# Patient Record
Sex: Male | Born: 1950
Health system: Southern US, Community
[De-identification: ages and names within clinical notes are randomized; demographics above are authoritative.]

## PROBLEM LIST (undated history)

## (undated) DIAGNOSIS — N200 Calculus of kidney: Secondary | ICD-10-CM

## (undated) DIAGNOSIS — M109 Gout, unspecified: Secondary | ICD-10-CM

## (undated) DIAGNOSIS — L409 Psoriasis, unspecified: Secondary | ICD-10-CM

## (undated) DIAGNOSIS — C801 Malignant (primary) neoplasm, unspecified: Secondary | ICD-10-CM

## (undated) DIAGNOSIS — I10 Essential (primary) hypertension: Secondary | ICD-10-CM

## (undated) DIAGNOSIS — D649 Anemia, unspecified: Secondary | ICD-10-CM

## (undated) DIAGNOSIS — Z973 Presence of spectacles and contact lenses: Secondary | ICD-10-CM

## (undated) DIAGNOSIS — Z87898 Personal history of other specified conditions: Secondary | ICD-10-CM

## (undated) DIAGNOSIS — Z972 Presence of dental prosthetic device (complete) (partial): Secondary | ICD-10-CM

## (undated) DIAGNOSIS — Z87442 Personal history of urinary calculi: Secondary | ICD-10-CM

## (undated) DIAGNOSIS — Z8739 Personal history of other diseases of the musculoskeletal system and connective tissue: Secondary | ICD-10-CM

## (undated) DIAGNOSIS — M351 Other overlap syndromes: Secondary | ICD-10-CM

## (undated) DIAGNOSIS — M1990 Unspecified osteoarthritis, unspecified site: Secondary | ICD-10-CM

## (undated) HISTORY — PX: TONSILLECTOMY: SUR1361

---

## 2012-02-28 ENCOUNTER — Emergency Department (HOSPITAL_COMMUNITY): Payer: Managed Care, Other (non HMO)

## 2012-02-28 ENCOUNTER — Encounter (HOSPITAL_COMMUNITY): Payer: Self-pay | Admitting: *Deleted

## 2012-02-28 ENCOUNTER — Emergency Department (HOSPITAL_COMMUNITY)
Admission: EM | Admit: 2012-02-28 | Discharge: 2012-02-29 | Disposition: A | Discharge status: Home / Self Care | Payer: Managed Care, Other (non HMO) | Attending: Emergency Medicine | Admitting: Emergency Medicine

## 2012-02-28 DIAGNOSIS — M7020 Olecranon bursitis, unspecified elbow: Secondary | ICD-10-CM | POA: Insufficient documentation

## 2012-02-28 DIAGNOSIS — M7021 Olecranon bursitis, right elbow: Secondary | ICD-10-CM

## 2012-02-28 DIAGNOSIS — E119 Type 2 diabetes mellitus without complications: Secondary | ICD-10-CM | POA: Insufficient documentation

## 2012-02-28 DIAGNOSIS — I10 Essential (primary) hypertension: Secondary | ICD-10-CM | POA: Insufficient documentation

## 2012-02-28 DIAGNOSIS — M109 Gout, unspecified: Secondary | ICD-10-CM | POA: Insufficient documentation

## 2012-02-28 DIAGNOSIS — M129 Arthropathy, unspecified: Secondary | ICD-10-CM | POA: Insufficient documentation

## 2012-02-28 HISTORY — DX: Unspecified osteoarthritis, unspecified site: M19.90

## 2012-02-28 HISTORY — DX: Gout, unspecified: M10.9

## 2012-02-28 HISTORY — DX: Essential (primary) hypertension: I10

## 2012-02-28 HISTORY — DX: Calculus of kidney: N20.0

## 2012-02-28 MED ORDER — OXYCODONE-ACETAMINOPHEN 5-325 MG PO TABS
1.0000 | ORAL_TABLET | Freq: Once | ORAL | Status: AC
  Administered 2012-02-28: 1 via ORAL
  Filled 2012-02-28: qty 1

## 2012-02-28 NOTE — ED Notes (Signed)
C/o right elbow pain, denies injury, heat to right elbow as well, hx of gout and arthritis

## 2012-02-29 MED ORDER — OXYCODONE-ACETAMINOPHEN 5-325 MG PO TABS: 1.0000 | ORAL_TABLET | ORAL | Status: AC | PRN

## 2012-02-29 MED ORDER — ONDANSETRON 4 MG PO TBDP
4.0000 mg | ORAL_TABLET | Freq: Once | ORAL | Status: AC
  Administered 2012-02-29: 4 mg via ORAL
  Filled 2012-02-29: qty 1

## 2012-02-29 MED ORDER — COLCHICINE 0.6 MG PO TABS: ORAL_TABLET | ORAL | Status: AC

## 2012-02-29 MED ORDER — COLCHICINE 0.6 MG PO TABS: ORAL_TABLET | ORAL | Status: DC

## 2012-02-29 MED ORDER — MORPHINE SULFATE 2 MG/ML IJ SOLN
6.0000 mg | Freq: Once | INTRAMUSCULAR | Status: AC
  Administered 2012-02-29: 6 mg via INTRAMUSCULAR
  Filled 2012-02-29: qty 3

## 2012-02-29 MED ORDER — PREDNISONE 20 MG PO TABS: ORAL_TABLET | ORAL | Status: DC

## 2012-02-29 NOTE — ED Notes (Signed)
Pt stable and ambulatory with steady gait; Instructed not to drive while on pain medication

## 2012-03-05 NOTE — ED Provider Notes (Signed)
History     CSN: [id number]  Arrival date & time 02/28/12  2037   First MD Initiated Contact with Patient 02/28/12 2218      Chief Complaint  Patient presents with  . Elbow Pain    (Consider location/radiation/quality/duration/timing/severity/associated sxs/prior treatment) HPI Comments: Patient c/o pain to his right elbow  For several days.  States he has h/o gout and pain to his elbow feels similar to previous gout attacks.  States he usually takes colchicine when he has similar pain but states his has ran out.  Pain is worse with extension of the arm and improves with rest.  States he uses his right arm frequently .  Denies numbness, neck pain, fever, wound or recent illness.   The history is provided by the patient.    Past Medical History  Diagnosis Date  . Gout   . Arthritis   . Hypertension   . Diabetes mellitus   . Kidney stones     Past Surgical History  Procedure Date  . Tonsillectomy     History reviewed. No pertinent family history.  History  Substance Use Topics  . Smoking status: Not on file  . Smokeless tobacco: Not on file  . Alcohol Use: No      Review of Systems  Constitutional: Negative for fever, chills, activity change and appetite change.  HENT: Negative for neck pain.   Respiratory: Negative for chest tightness and shortness of breath.   Cardiovascular: Negative for chest pain.  Genitourinary: Negative for dysuria and difficulty urinating.  Musculoskeletal: Positive for joint swelling and arthralgias.  Skin: Negative for color change, rash and wound.  Neurological: Negative for dizziness, weakness, numbness and headaches.  All other systems reviewed and are negative.    Allergies  Review of patient's allergies indicates no known allergies.  Home Medications   Current Outpatient Rx  Name Route Sig Dispense Refill  . ACETAMINOPHEN 500 MG PO TABS Oral Take 1,000 mg by mouth as needed.    . ALLOPURINOL 300 MG PO TABS Oral Take 300  mg by mouth daily.    Marland Kitchen AMLODIPINE BESYLATE 5 MG PO TABS Oral Take 5 mg by mouth daily.    . ASPIRIN EC 81 MG PO TBEC Oral Take 81 mg by mouth daily.    . FENOFIBRATE 160 MG PO TABS Oral Take 160 mg by mouth daily.    Marland Kitchen FOLIC ACID 1 MG PO TABS Oral Take 1 mg by mouth daily.    Marland Kitchen LOSARTAN POTASSIUM 100 MG PO TABS Oral Take 100 mg by mouth daily.    Marland Kitchen METFORMIN HCL 1000 MG PO TABS Oral Take 1,000 mg by mouth 2 (two) times daily with a meal.    . METHOTREXATE (ANTI-RHEUMATIC) 2.5 MG PO TABS Oral Take 15 mg by mouth once a week. On SUNDAYS    . METOPROLOL TARTRATE 100 MG PO TABS Oral Take 100 mg by mouth 2 (two) times daily.    Marland Kitchen NABUMETONE 750 MG PO TABS Oral Take 750 mg by mouth 2 (two) times daily.    Marland Kitchen PIOGLITAZONE HCL 45 MG PO TABS Oral Take 45 mg by mouth daily.    Marland Kitchen POTASSIUM CHLORIDE CRYS ER 20 MEQ PO TBCR Oral Take 20 mEq by mouth daily.    Marland Kitchen SIMVASTATIN 10 MG PO TABS Oral Take 10 mg by mouth daily.    . TORSEMIDE 20 MG PO TABS Oral Take 20 mg by mouth daily.    . COLCHICINE 0.6 MG PO  TABS  One tab po q 2 hrs until pain relief or stomach upset 12 tablet 0  . OXYCODONE-ACETAMINOPHEN 5-325 MG PO TABS Oral Take 1 tablet by mouth every 4 (four) hours as needed for pain. 15 tablet 0  . PREDNISONE 20 MG PO TABS  One po qd x 5 days 5 tablet 0    BP 151/92  Pulse 103  Temp 98.2 F (36.8 C) (Oral)  Resp 20  Ht 6\' 4"  (1.93 m)  Wt 285 lb (129.275 kg)  BMI 34.69 kg/m2  SpO2 100%  Physical Exam  Nursing note and vitals reviewed. Constitutional: He is oriented to person, place, and time. He appears well-developed and well-nourished. No distress.  HENT:  Head: Normocephalic and atraumatic.  Cardiovascular: Normal rate, regular rhythm and normal heart sounds.   Pulmonary/Chest: Effort normal and breath sounds normal.  Musculoskeletal: He exhibits edema and tenderness.       Right elbow: He exhibits swelling. He exhibits normal range of motion, no effusion, no deformity and no laceration.  tenderness found. Lateral epicondyle and olecranon process tenderness noted.       Arms:      ttp at the olecranon process of the right elbow.  Radial pulse is brisk, sensation intact.  CR< 2 sec.  Pt has full ROM elbow,but pain with extension.  Mild erythema.  No wound  Neurological: He is alert and oriented to person, place, and time. He exhibits normal muscle tone. Coordination normal.  Skin: Skin is warm and dry.    ED Course  Procedures (including critical care time)  Results for orders placed during the hospital encounter of 02/28/12  URIC ACID      Component Value Range   Uric Acid, Serum 7.5  4.0 - 7.8 mg/dL     Dg Elbow Complete Right  02/28/2012  *RADIOLOGY REPORT*  Clinical Data: Right elbow pain and swelling.  No injury.  History of arthritis and gout.  RIGHT ELBOW - COMPLETE 3+ VIEW  Comparison: None.  Findings: Degenerative narrowing and hypertrophic changes in the elbow joint.  Moderate anterior left elbow effusion.  No focal bone or cortical erosion.  No focal bone lesion or destruction.  No radiopaque soft tissue foreign bodies or calcifications.  IMPRESSION: Degenerative changes in the right elbow with moderate sized right elbow effusion.  No erosive changes suggested.  Original Report Authenticated By: Marlon Pel, M.D.     1. Olecranon bursitis of right elbow       MDM     Localized ttp and edema of the posterior right elbow.  Pt has full ROM but pain reproduced with extension.  Pt is non-toxic appearing.  No excessive warmth of the joint on exam or hx of recent illness.  Doubtful of septic joint.  Pt agrees to close orthopedic follow-up or to return here if sx worsen  The patient appears reasonably screened and/or stabilized for discharge and I doubt any other medical condition or other [facility] requiring further screening, evaluation, or treatment in the ED at this time prior to discharge.      Ehtan Delfavero L. [place], [place] 03/05/12 2223

## 2012-03-08 NOTE — ED Provider Notes (Signed)
Medical screening examination/treatment/procedure(s) were performed by non-physician practitioner and as supervising physician I was immediately available for consultation/collaboration.  Donnetta Hutching, MD 03/08/12 [phone]

## 2014-07-02 ENCOUNTER — Encounter: Payer: Self-pay | Admitting: Surgery

## 2014-07-14 ENCOUNTER — Encounter: Payer: Self-pay | Admitting: Surgery

## 2016-04-28 ENCOUNTER — Other Ambulatory Visit: Payer: Self-pay | Admitting: Urology

## 2016-04-28 ENCOUNTER — Ambulatory Visit (INDEPENDENT_AMBULATORY_CARE_PROVIDER_SITE_OTHER): Payer: 59 | Admitting: Urology

## 2016-04-28 DIAGNOSIS — R972 Elevated prostate specific antigen [PSA]: Secondary | ICD-10-CM

## 2016-06-02 ENCOUNTER — Ambulatory Visit (HOSPITAL_COMMUNITY)
Admission: RE | Admit: 2016-06-02 | Discharge: 2016-06-02 | Disposition: A | Discharge status: Home / Self Care | Payer: 59 | Source: Ambulatory Visit | Attending: Urology | Admitting: Urology

## 2016-06-02 DIAGNOSIS — C61 Malignant neoplasm of prostate: Secondary | ICD-10-CM | POA: Diagnosis not present

## 2016-06-02 DIAGNOSIS — R972 Elevated prostate specific antigen [PSA]: Secondary | ICD-10-CM | POA: Diagnosis not present

## 2016-06-02 MED ORDER — LIDOCAINE HCL (PF) 2 % IJ SOLN
INTRAMUSCULAR | Status: AC
  Administered 2016-06-02: 10 mL
  Filled 2016-06-02: qty 10

## 2016-06-02 MED ORDER — CEFTRIAXONE SODIUM 1 G IJ SOLR
1.0000 g | Freq: Once | INTRAMUSCULAR | Status: AC
  Administered 2016-06-02: 1 g via INTRAMUSCULAR

## 2016-06-02 MED ORDER — CEFTRIAXONE SODIUM 1 G IJ SOLR
INTRAMUSCULAR | Status: AC
  Filled 2016-06-02: qty 10

## 2016-06-02 MED ORDER — LIDOCAINE HCL (PF) 1 % IJ SOLN
INTRAMUSCULAR | Status: AC
  Administered 2016-06-02: 5 mL
  Filled 2016-06-02: qty 5

## 2016-06-02 NOTE — Discharge Instructions (Signed)
Transrectal Ultrasound-Guided Biopsy °A transrectal ultrasound-guided biopsy is a procedure to take samples of tissue from your prostate. Ultrasound images are used to guide the procedure. It is usually done to check the prostate gland for cancer. °BEFORE THE PROCEDURE °· Do not eat or drink after midnight on the night before your procedure. °· Take medicines as your doctor tells you. °· Your doctor may have you stop taking some medicines 5-7 days before the procedure. °· You will be given an enema before your procedure. During an enema, a liquid is put into your butt (rectum) to clear out waste. °· You may have lab tests the day of your procedure. °· Make plans to have someone drive you home. °PROCEDURE °· You will be given medicine to help you relax before the procedure. An IV tube will be put into one of your veins. It will be used to give fluids and medicine. °· You will be given medicine to reduce the risk of infection (antibiotic). °· You will be placed on your side. °· A probe with gel will be put in your butt. This is used to take pictures of your prostate and the area around it. °· A medicine to numb the area is put into your prostate. °· A biopsy needle is then inserted and guided to your prostate. °· Samples of prostate tissue are taken. The needle is removed. °· The samples are sent to a lab to be checked. Results are usually back in 2-3 days. °AFTER THE PROCEDURE °· You will be taken to a room where you will be watched until you are doing okay. °· You may have some pain in the area around your butt. You will be given medicines for this. °· You may be able to go home the same day. Sometimes, an overnight stay in the hospital is needed. °  °This information is not intended to replace advice given to you by your health care provider. Make sure you discuss any questions you have with your health care provider. °  °Document Released: 07/19/2009 Document Revised: 08/05/2013 Document Reviewed:  03/19/2013 °Elsevier Interactive Patient Education ©2016 [employer] ° °

## 2016-06-16 ENCOUNTER — Ambulatory Visit (INDEPENDENT_AMBULATORY_CARE_PROVIDER_SITE_OTHER): Payer: 59 | Admitting: Urology

## 2016-06-16 DIAGNOSIS — C61 Malignant neoplasm of prostate: Secondary | ICD-10-CM

## 2016-08-11 ENCOUNTER — Telehealth: Payer: Self-pay | Admitting: *Deleted

## 2016-08-11 NOTE — Telephone Encounter (Signed)
Called patient to ask question, lvm for a return call 

## 2016-08-11 NOTE — Telephone Encounter (Signed)
CALLED PATIENT TO INFORM OF PRE-SEED APPT. ON 08-25-16 @ 2 PM, LVM FOR A RETURN CALL

## 2016-08-24 ENCOUNTER — Telehealth: Payer: Self-pay | Admitting: *Deleted

## 2016-08-24 NOTE — Telephone Encounter (Signed)
RETURNED PATIENT'S WIFE PHONE CALL, NO ANSWER

## 2016-08-24 NOTE — Telephone Encounter (Signed)
Called patient to remind of pre-seed appt. For 08-25-16, spoke with patient's wife Thayer Headings and they are aware of this appt.

## 2016-08-25 ENCOUNTER — Ambulatory Visit
Admission: RE | Admit: 2016-08-25 | Discharge: 2016-08-25 | Disposition: A | Discharge status: Home / Self Care | Payer: Managed Care, Other (non HMO) | Source: Ambulatory Visit | Attending: Radiation Oncology | Admitting: Radiation Oncology

## 2016-08-25 ENCOUNTER — Ambulatory Visit
Admission: RE | Admit: 2016-08-25 | Discharge: 2016-08-25 | Disposition: A | Discharge status: Home / Self Care | Payer: 59 | Source: Ambulatory Visit | Attending: Radiation Oncology | Admitting: Radiation Oncology

## 2016-08-25 DIAGNOSIS — C61 Malignant neoplasm of prostate: Secondary | ICD-10-CM

## 2016-08-25 NOTE — Progress Notes (Signed)
Consult at [facility] by me on 06/28/16    Planned Seed Implant

## 2016-08-25 NOTE — Progress Notes (Signed)
  Radiation Oncology         [phone] ________________________________  Name: SCHYLER MCKEEN MRN: [id number]  Date: 08/25/2016  DOB: 1950/08/29  SIMULATION AND TREATMENT PLANNING NOTE PUBIC ARCH STUDY  [id number], MD  Asencion Noble, MD  DIAGNOSIS: 66 yo man with T1c prostate cancer, Gleason 3+4 and PSA 4.9       ICD-9-CM ICD-10-CM   1. Malignant neoplasm of prostate ([place]) 185 C61     COMPLEX SIMULATION:  The patient presented today for evaluation for possible prostate seed implant. He was brought to the radiation planning suite and placed supine on the CT couch. A 3-dimensional image study set was obtained in upload to the planning computer. There, on each axial slice, I contoured the prostate gland. Then, using three-dimensional radiation planning tools I reconstructed the prostate in view of the structures from the transperineal needle pathway to assess for possible pubic arch interference. In doing so, I did not appreciate any pubic arch interference. Also, the patient's prostate volume was estimated based on the drawn structure. The volume was 33 cc.  Given the pubic arch appearance and prostate volume, patient remains a good candidate to proceed with prostate seed implant. Today, he freely provided informed written consent to proceed.    PLAN: The patient will undergo prostate seed implant.   ________________________________  Sheral Apley. Tammi Klippel, M.D.

## 2016-08-28 ENCOUNTER — Other Ambulatory Visit: Payer: Self-pay | Admitting: Urology

## 2016-08-29 ENCOUNTER — Telehealth: Payer: Self-pay | Admitting: *Deleted

## 2016-08-29 NOTE — Telephone Encounter (Signed)
CALLED PATIENT TO INFORM OF IMPLANT DATE, SPOKE WITH PATIENT'S WIFE , JANICE AND SHE IS AWARE OF THIS IMPLANT DATE

## 2016-09-01 ENCOUNTER — Ambulatory Visit (HOSPITAL_BASED_OUTPATIENT_CLINIC_OR_DEPARTMENT_OTHER)
Admission: RE | Admit: 2016-09-01 | Discharge: 2016-09-01 | Disposition: A | Discharge status: Home / Self Care | Payer: 59 | Source: Ambulatory Visit | Attending: Urology | Admitting: Urology

## 2016-09-01 ENCOUNTER — Encounter (HOSPITAL_BASED_OUTPATIENT_CLINIC_OR_DEPARTMENT_OTHER)
Admission: RE | Admit: 2016-09-01 | Discharge: 2016-09-01 | Disposition: A | Discharge status: Home / Self Care | Payer: 59 | Source: Ambulatory Visit | Attending: Urology | Admitting: Urology

## 2016-09-01 ENCOUNTER — Other Ambulatory Visit: Payer: Self-pay

## 2016-09-01 DIAGNOSIS — Z01818 Encounter for other preprocedural examination: Secondary | ICD-10-CM | POA: Diagnosis present

## 2016-10-20 ENCOUNTER — Encounter (HOSPITAL_BASED_OUTPATIENT_CLINIC_OR_DEPARTMENT_OTHER): Payer: Self-pay | Admitting: *Deleted

## 2016-10-20 DIAGNOSIS — Z7982 Long term (current) use of aspirin: Secondary | ICD-10-CM | POA: Diagnosis not present

## 2016-10-20 DIAGNOSIS — L4050 Arthropathic psoriasis, unspecified: Secondary | ICD-10-CM | POA: Diagnosis not present

## 2016-10-20 DIAGNOSIS — C61 Malignant neoplasm of prostate: Secondary | ICD-10-CM | POA: Diagnosis present

## 2016-10-20 DIAGNOSIS — M109 Gout, unspecified: Secondary | ICD-10-CM | POA: Diagnosis not present

## 2016-10-20 DIAGNOSIS — I10 Essential (primary) hypertension: Secondary | ICD-10-CM | POA: Diagnosis not present

## 2016-10-20 DIAGNOSIS — Z7984 Long term (current) use of oral hypoglycemic drugs: Secondary | ICD-10-CM | POA: Diagnosis not present

## 2016-10-20 DIAGNOSIS — Z7952 Long term (current) use of systemic steroids: Secondary | ICD-10-CM | POA: Diagnosis not present

## 2016-10-20 DIAGNOSIS — Z79899 Other long term (current) drug therapy: Secondary | ICD-10-CM | POA: Diagnosis not present

## 2016-10-20 DIAGNOSIS — E119 Type 2 diabetes mellitus without complications: Secondary | ICD-10-CM | POA: Diagnosis not present

## 2016-10-20 LAB — COMPREHENSIVE METABOLIC PANEL
ALBUMIN: 4.3 g/dL (ref 3.5–5.0)
ALT: 27 U/L (ref 17–63)
ANION GAP: 7 (ref 5–15)
AST: 18 U/L (ref 15–41)
Alkaline Phosphatase: 81 U/L (ref 38–126)
BILIRUBIN TOTAL: 1.2 mg/dL (ref 0.3–1.2)
BUN: 18 mg/dL (ref 6–20)
CO2: 29 mmol/L (ref 22–32)
Calcium: 9.5 mg/dL (ref 8.9–10.3)
Chloride: 105 mmol/L (ref 101–111)
Creatinine, Ser: 1.46 mg/dL — ABNORMAL HIGH (ref 0.61–1.24)
GFR calc Af Amer: 57 mL/min — ABNORMAL LOW (ref >60)
GFR calc non Af Amer: 49 mL/min — ABNORMAL LOW (ref >60)
GLUCOSE: 88 mg/dL (ref 65–99)
POTASSIUM: 4.7 mmol/L (ref 3.5–5.1)
Sodium: 141 mmol/L (ref 135–145)
TOTAL PROTEIN: 7.8 g/dL (ref 6.5–8.1)

## 2016-10-20 LAB — CBC
HEMATOCRIT: 43.2 % (ref 39.0–52.0)
Hemoglobin: 14.3 g/dL (ref 13.0–17.0)
MCH: 30.6 pg (ref 26.0–34.0)
MCHC: 33.1 g/dL (ref 30.0–36.0)
MCV: 92.5 fL (ref 78.0–100.0)
PLATELETS: 308 10*3/uL (ref 150–400)
RBC: 4.67 MIL/uL (ref 4.22–5.81)
RDW: 14.5 % (ref 11.5–15.5)
WBC: 9 10*3/uL (ref 4.0–10.5)

## 2016-10-20 LAB — PROTIME-INR
INR: 1.08
PROTHROMBIN TIME: 14 s (ref 11.4–15.2)

## 2016-10-20 LAB — APTT: APTT: 28 s (ref 24–36)

## 2016-10-20 NOTE — Progress Notes (Signed)
Pt instructed npo pmn 3/15 x plaquenil, lopressor w sip of water.  To [facility] 3/16 @ 0915.  Labs, ekg, cxr in epic.  Pt aware to do fleets enema am of surgery, prior to arrival.

## 2016-10-23 ENCOUNTER — Ambulatory Visit: Admission: RE | Admit: 2016-10-23 | Payer: 59 | Source: Ambulatory Visit

## 2016-10-26 ENCOUNTER — Telehealth: Payer: Self-pay | Admitting: *Deleted

## 2016-10-26 NOTE — Progress Notes (Signed)
  Radiation Oncology         [phone] ________________________________  Name: Peter Martinez MRN: [id number]  Date: 10/26/2016  DOB: 1951/03/21       Prostate Seed Implant  TH:YHOOI,LNZ, MD  No ref. provider found  DIAGNOSIS: 66 yo man with T1c prostate cancer, Gleason 3+4 and PSA 4.9.    ICD-9-CM ICD-10-CM   1. Pre-op testing V72.84 Z01.818 DG Chest 2 View     DG Chest 2 View    PROCEDURE: Insertion of radioactive I-125 seeds into the prostate gland.  RADIATION DOSE: 145 Gy, definitive therapy.  TECHNIQUE: Peter Martinez was brought to the operating room with the urologist. He was placed in the dorsolithotomy position. He was catheterized and a rectal tube was inserted. The perineum was shaved, prepped and draped. The ultrasound probe was then introduced into the rectum to see the prostate gland.  TREATMENT DEVICE: A needle grid was attached to the ultrasound probe stand and anchor needles were placed.  3D PLANNING: The prostate was imaged in 3D using a sagittal sweep of the prostate probe. These images were transferred to the planning computer. There, the prostate, urethra and rectum were defined on each axial reconstructed image. Then, the software created an optimized 3D plan and a few seed positions were adjusted. The quality of the plan was reviewed using [facility] information for the target and the following two organs at risk:  Urethra and Rectum.  Then the accepted plan was uploaded to the seed Selectron afterloading unit.  PROSTATE VOLUME STUDY:  Using transrectal ultrasound the volume of the prostate was verified to be 38 cc.  SPECIAL TREATMENT PROCEDURE/SUPERVISION AND HANDLING: The Nucletron FIRST system was used to place the needles under sagittal guidance. A total of 24 needles were used to deposit 73 seeds in the prostate gland. The individual seed activity was 0.422 mCi.  COMPLEX SIMULATION: At the end of the procedure, an anterior radiograph of the pelvis was obtained to  document seed positioning and count. Cystoscopy was performed to check the urethra and bladder.  MICRODOSIMETRY: At the end of the procedure, the patient was emitting 0.174 mR/hr at 1 meter. Accordingly, he was considered safe for hospital discharge.  PLAN: The patient will return to the radiation oncology clinic for post implant CT dosimetry in three weeks.   ________________________________  Peter Martinez, M.D.

## 2016-10-26 NOTE — Telephone Encounter (Signed)
Called patient to remind of implant for 10-27-16, lvm for a return call

## 2016-10-27 ENCOUNTER — Encounter (HOSPITAL_BASED_OUTPATIENT_CLINIC_OR_DEPARTMENT_OTHER)
Admission: RE | Disposition: A | Discharge status: Home / Self Care | Payer: Self-pay | Source: Ambulatory Visit | Attending: Urology

## 2016-10-27 ENCOUNTER — Ambulatory Visit (HOSPITAL_BASED_OUTPATIENT_CLINIC_OR_DEPARTMENT_OTHER): Payer: 59 | Admitting: Anesthesiology

## 2016-10-27 ENCOUNTER — Ambulatory Visit (HOSPITAL_COMMUNITY): Payer: 59

## 2016-10-27 ENCOUNTER — Encounter (HOSPITAL_BASED_OUTPATIENT_CLINIC_OR_DEPARTMENT_OTHER): Payer: Self-pay | Admitting: Anesthesiology

## 2016-10-27 ENCOUNTER — Ambulatory Visit (HOSPITAL_BASED_OUTPATIENT_CLINIC_OR_DEPARTMENT_OTHER)
Admission: RE | Admit: 2016-10-27 | Discharge: 2016-10-27 | Disposition: A | Discharge status: Home / Self Care | Payer: 59 | Source: Ambulatory Visit | Attending: Urology | Admitting: Urology

## 2016-10-27 DIAGNOSIS — C61 Malignant neoplasm of prostate: Secondary | ICD-10-CM | POA: Insufficient documentation

## 2016-10-27 DIAGNOSIS — L4050 Arthropathic psoriasis, unspecified: Secondary | ICD-10-CM | POA: Insufficient documentation

## 2016-10-27 DIAGNOSIS — Z7982 Long term (current) use of aspirin: Secondary | ICD-10-CM | POA: Insufficient documentation

## 2016-10-27 DIAGNOSIS — E119 Type 2 diabetes mellitus without complications: Secondary | ICD-10-CM | POA: Insufficient documentation

## 2016-10-27 DIAGNOSIS — M109 Gout, unspecified: Secondary | ICD-10-CM | POA: Insufficient documentation

## 2016-10-27 DIAGNOSIS — Z79899 Other long term (current) drug therapy: Secondary | ICD-10-CM | POA: Insufficient documentation

## 2016-10-27 DIAGNOSIS — I10 Essential (primary) hypertension: Secondary | ICD-10-CM | POA: Insufficient documentation

## 2016-10-27 DIAGNOSIS — Z01818 Encounter for other preprocedural examination: Secondary | ICD-10-CM

## 2016-10-27 DIAGNOSIS — Z7984 Long term (current) use of oral hypoglycemic drugs: Secondary | ICD-10-CM | POA: Insufficient documentation

## 2016-10-27 DIAGNOSIS — Z7952 Long term (current) use of systemic steroids: Secondary | ICD-10-CM | POA: Insufficient documentation

## 2016-10-27 HISTORY — PX: RADIOACTIVE SEED IMPLANT: SHX5150

## 2016-10-27 HISTORY — DX: Presence of dental prosthetic device (complete) (partial): Z97.2

## 2016-10-27 HISTORY — DX: Personal history of other diseases of the musculoskeletal system and connective tissue: Z87.39

## 2016-10-27 HISTORY — DX: Presence of spectacles and contact lenses: Z97.3

## 2016-10-27 HISTORY — DX: Personal history of other specified conditions: Z87.898

## 2016-10-27 LAB — GLUCOSE, CAPILLARY
GLUCOSE-CAPILLARY: 94 mg/dL (ref 65–99)
Glucose-Capillary: 84 mg/dL (ref 65–99)

## 2016-10-27 SURGERY — INSERTION, RADIATION SOURCE, PROSTATE
Anesthesia: General

## 2016-10-27 MED ORDER — FENTANYL CITRATE (PF) 100 MCG/2ML IJ SOLN
INTRAMUSCULAR | Status: AC
  Filled 2016-10-27: qty 2

## 2016-10-27 MED ORDER — MIDAZOLAM HCL 5 MG/5ML IJ SOLN
INTRAMUSCULAR | Status: DC | PRN
  Administered 2016-10-27: 2 mg via INTRAVENOUS

## 2016-10-27 MED ORDER — SUGAMMADEX SODIUM 200 MG/2ML IV SOLN
INTRAVENOUS | Status: AC
  Filled 2016-10-27: qty 2

## 2016-10-27 MED ORDER — SODIUM CHLORIDE 0.9 % IV SOLN
INTRAVENOUS | Status: DC | PRN
  Administered 2016-10-27: 1000 mL

## 2016-10-27 MED ORDER — PROPOFOL 10 MG/ML IV BOLUS
INTRAVENOUS | Status: AC
  Filled 2016-10-27: qty 20

## 2016-10-27 MED ORDER — PROPOFOL 10 MG/ML IV BOLUS
INTRAVENOUS | Status: DC | PRN
  Administered 2016-10-27: 50 mg via INTRAVENOUS
  Administered 2016-10-27: 200 mg via INTRAVENOUS

## 2016-10-27 MED ORDER — ONDANSETRON HCL 4 MG/2ML IJ SOLN
INTRAMUSCULAR | Status: DC | PRN
  Administered 2016-10-27: 4 mg via INTRAVENOUS

## 2016-10-27 MED ORDER — FENTANYL CITRATE (PF) 100 MCG/2ML IJ SOLN
INTRAMUSCULAR | Status: DC | PRN
  Administered 2016-10-27 (×2): 50 ug via INTRAVENOUS

## 2016-10-27 MED ORDER — HYDROMORPHONE HCL 1 MG/ML IJ SOLN
0.2500 mg | INTRAMUSCULAR | Status: DC | PRN
  Filled 2016-10-27: qty 0.5

## 2016-10-27 MED ORDER — METOPROLOL TARTRATE 100 MG PO TABS
100.0000 mg | ORAL_TABLET | Freq: Once | ORAL | Status: AC
  Administered 2016-10-27: 100 mg via ORAL
  Filled 2016-10-27 (×2): qty 1

## 2016-10-27 MED ORDER — LIDOCAINE 2% (20 MG/ML) 5 ML SYRINGE
INTRAMUSCULAR | Status: DC | PRN
  Administered 2016-10-27: 50 mg via INTRAVENOUS

## 2016-10-27 MED ORDER — MIDAZOLAM HCL 2 MG/2ML IJ SOLN
INTRAMUSCULAR | Status: AC
  Filled 2016-10-27: qty 2

## 2016-10-27 MED ORDER — DEXAMETHASONE SODIUM PHOSPHATE 10 MG/ML IJ SOLN
INTRAMUSCULAR | Status: AC
  Filled 2016-10-27: qty 1

## 2016-10-27 MED ORDER — TRAMADOL HCL 50 MG PO TABS: 50.0000 mg | ORAL_TABLET | Freq: Four times a day (QID) | ORAL | 0 refills | Status: DC | PRN

## 2016-10-27 MED ORDER — SUCCINYLCHOLINE CHLORIDE 20 MG/ML IJ SOLN
INTRAMUSCULAR | Status: DC | PRN
  Administered 2016-10-27: 100 mg via INTRAVENOUS

## 2016-10-27 MED ORDER — EPHEDRINE 5 MG/ML INJ
INTRAVENOUS | Status: AC
  Filled 2016-10-27: qty 20

## 2016-10-27 MED ORDER — GLYCOPYRROLATE 0.2 MG/ML IJ SOLN
INTRAMUSCULAR | Status: DC | PRN
  Administered 2016-10-27: 0.2 mg via INTRAVENOUS

## 2016-10-27 MED ORDER — EPHEDRINE SULFATE 50 MG/ML IJ SOLN
INTRAMUSCULAR | Status: DC | PRN
  Administered 2016-10-27: 5 mg via INTRAVENOUS
  Administered 2016-10-27: 10 mg via INTRAVENOUS
  Administered 2016-10-27: 5 mg via INTRAVENOUS
  Administered 2016-10-27: 10 mg via INTRAVENOUS
  Administered 2016-10-27: 5 mg via INTRAVENOUS

## 2016-10-27 MED ORDER — DEXAMETHASONE SODIUM PHOSPHATE 4 MG/ML IJ SOLN
INTRAMUSCULAR | Status: DC | PRN
  Administered 2016-10-27: 8 mg via INTRAVENOUS

## 2016-10-27 MED ORDER — LIDOCAINE 2% (20 MG/ML) 5 ML SYRINGE
INTRAMUSCULAR | Status: AC
  Filled 2016-10-27: qty 5

## 2016-10-27 MED ORDER — LACTATED RINGERS IV SOLN
INTRAVENOUS | Status: DC
  Administered 2016-10-27 (×3): via INTRAVENOUS
  Filled 2016-10-27: qty 1000

## 2016-10-27 MED ORDER — GENTAMICIN SULFATE 40 MG/ML IJ SOLN
480.0000 mg | Freq: Once | INTRAVENOUS | Status: AC
  Administered 2016-10-27: 480 mg via INTRAVENOUS
  Filled 2016-10-27 (×2): qty 12

## 2016-10-27 MED ORDER — IOHEXOL 300 MG/ML  SOLN
INTRAMUSCULAR | Status: DC | PRN
  Administered 2016-10-27: 5 mL

## 2016-10-27 MED ORDER — FLEET ENEMA 7-19 GM/118ML RE ENEM
1.0000 | ENEMA | Freq: Once | RECTAL | Status: AC
  Administered 2016-10-27: 1 via RECTAL
  Filled 2016-10-27: qty 1

## 2016-10-27 MED ORDER — ROCURONIUM BROMIDE 100 MG/10ML IV SOLN
INTRAVENOUS | Status: DC | PRN
  Administered 2016-10-27: 30 mg via INTRAVENOUS

## 2016-10-27 MED ORDER — TAMSULOSIN HCL 0.4 MG PO CAPS: 0.4000 mg | ORAL_CAPSULE | Freq: Every day | ORAL | 0 refills | Status: DC

## 2016-10-27 MED ORDER — SUGAMMADEX SODIUM 200 MG/2ML IV SOLN
INTRAVENOUS | Status: DC | PRN
  Administered 2016-10-27: 198.2 mg via INTRAVENOUS

## 2016-10-27 MED ORDER — ONDANSETRON HCL 4 MG/2ML IJ SOLN
INTRAMUSCULAR | Status: AC
  Filled 2016-10-27: qty 2

## 2016-10-27 SURGICAL SUPPLY — 29 items
BAG URINE DRAINAGE (UROLOGICAL SUPPLIES) ×2 IMPLANT
BASKET ZERO TIP NITINOL 2.4FR (BASKET) ×2 IMPLANT
BLADE CLIPPER SURG (BLADE) ×2 IMPLANT
CATH FOLEY 2WAY SLVR  5CC 16FR (CATHETERS) ×2
CATH FOLEY 2WAY SLVR 5CC 16FR (CATHETERS) ×2 IMPLANT
CATH ROBINSON RED A/P 20FR (CATHETERS) ×2 IMPLANT
CLOTH BEACON ORANGE TIMEOUT ST (SAFETY) ×2 IMPLANT
COVER BACK TABLE 60X90IN (DRAPES) ×2 IMPLANT
COVER MAYO STAND STRL (DRAPES) ×2 IMPLANT
DRSG TEGADERM 4X4.75 (GAUZE/BANDAGES/DRESSINGS) ×2 IMPLANT
DRSG TEGADERM 8X12 (GAUZE/BANDAGES/DRESSINGS) ×2 IMPLANT
GAUZE SPONGE 4X4 12PLY STRL LF (GAUZE/BANDAGES/DRESSINGS) ×2 IMPLANT
GLOVE BIO SURGEON STRL SZ7.5 (GLOVE) ×4 IMPLANT
GLOVE ECLIPSE 8.0 STRL XLNG CF (GLOVE) ×16 IMPLANT
GOWN STRL REUS W/ TWL LRG LVL3 (GOWN DISPOSABLE) ×1 IMPLANT
GOWN STRL REUS W/ TWL XL LVL3 (GOWN DISPOSABLE) ×1 IMPLANT
GOWN STRL REUS W/TWL LRG LVL3 (GOWN DISPOSABLE) ×1
GOWN STRL REUS W/TWL XL LVL3 (GOWN DISPOSABLE) ×1
HOLDER FOLEY CATH W/STRAP (MISCELLANEOUS) ×2 IMPLANT
IV NS 1000ML (IV SOLUTION) ×1
IV NS 1000ML BAXH (IV SOLUTION) ×1 IMPLANT
KIT RM TURNOVER CYSTO AR (KITS) ×2 IMPLANT
MANIFOLD NEPTUNE II (INSTRUMENTS) IMPLANT
PACK CYSTO (CUSTOM PROCEDURE TRAY) ×2 IMPLANT
SYRINGE 10CC LL (SYRINGE) ×2 IMPLANT
TUBE CONNECTING 12X1/4 (SUCTIONS) IMPLANT
UNDERPAD 30X30 INCONTINENT (UNDERPADS AND DIAPERS) ×4 IMPLANT
WATER STERILE IRR 500ML POUR (IV SOLUTION) ×2 IMPLANT
radioactive seeds ×2 IMPLANT

## 2016-10-27 NOTE — Anesthesia Preprocedure Evaluation (Signed)
Anesthesia Evaluation  Patient identified by MRN, date of birth, ID band Patient awake    Reviewed: Allergy & Precautions, NPO status , Patient's Chart, lab work & pertinent test results  Airway Mallampati: II  TM Distance: >3 FB     Dental   Pulmonary neg pulmonary ROS,    breath sounds clear to auscultation       Cardiovascular hypertension,  Rhythm:Regular Rate:Normal     Neuro/Psych negative neurological ROS     GI/Hepatic negative GI ROS, Neg liver ROS,   Endo/Other  diabetes  Renal/GU Renal disease     Musculoskeletal  (+) Arthritis ,   Abdominal   Peds  Hematology   Anesthesia Other Findings   Reproductive/Obstetrics                             Anesthesia Physical Anesthesia Plan  ASA: III  Anesthesia Plan: General   Post-op Pain Management:    Induction: Intravenous  Airway Management Planned: Oral ETT  Additional Equipment:   Intra-op Plan:   Post-operative Plan: Extubation in OR  Informed Consent: I have reviewed the patients History and Physical, chart, labs and discussed the procedure including the risks, benefits and alternatives for the proposed anesthesia with the patient or authorized representative who has indicated his/her understanding and acceptance.   Dental advisory given  Plan Discussed with: CRNA and Anesthesiologist  Anesthesia Plan Comments:         Anesthesia Quick Evaluation

## 2016-10-27 NOTE — Brief Op Note (Signed)
10/27/2016  1:18 PM  PATIENT:  Peter Martinez  66 y.o. male  PRE-OPERATIVE DIAGNOSIS:  PROSTATE CANCER  POST-OPERATIVE DIAGNOSIS:  PROSTATE CANCER  PROCEDURE:  Procedure(s): RADIOACTIVE SEED IMPLANT/BRACHYTHERAPY IMPLANT (N/A), Cystoscopy  SURGEON:  Surgeon(s) and Role:    * Alexis Frock, MD - Primary    * Tyler Pita, MD - Assisting  PHYSICIAN ASSISTANT:   ASSISTANTS: none   ANESTHESIA:   general  EBL:  Total I/O In: 1000 [I.V.:1000] Out: 5 [Blood:5]  BLOOD ADMINISTERED:none  DRAINS: none   LOCAL MEDICATIONS USED:  NONE  SPECIMEN:  No Specimen  DISPOSITION OF SPECIMEN:  N/A  COUNTS:  YES  TOURNIQUET:  * No tourniquets in log *  DICTATION: .Other Dictation: Dictation Number [id number]  PLAN OF CARE: Discharge to home after PACU  PATIENT DISPOSITION:  PACU - hemodynamically stable.   Delay start of Pharmacological VTE agent (>24hrs) due to surgical blood loss or risk of bleeding: yes

## 2016-10-27 NOTE — Discharge Instructions (Signed)
°  Post Anesthesia Home Care Instructions  Activity: Get plenty of rest for the remainder of the day. A responsible adult should stay with you for 24 hours following the procedure.  For the next 24 hours, DO NOT: -Drive a car -Paediatric nurse -Drink alcoholic beverages -Take any medication unless instructed by your physician -Make any legal decisions or sign important papers.  Meals: Start with liquid foods such as gelatin or soup. Progress to regular foods as tolerated. Avoid greasy, spicy, heavy foods. If nausea and/or vomiting occur, drink only clear liquids until the nausea and/or vomiting subsides. Call your physician if vomiting continues.  Special Instructions/Symptoms: Your throat may feel dry or sore from the anesthesia or the breathing tube placed in your throat during surgery. If this causes discomfort, gargle with warm salt water. The discomfort should disappear within 24 hours.  If you had a scopolamine patch placed behind your ear for the management of post- operative nausea and/or vomiting:  1. The medication in the patch is effective for 72 hours, after which it should be removed.  Wrap patch in a tissue and discard in the trash. Wash hands thoroughly with soap and water. 2. You may remove the patch earlier than 72 hours if you experience unpleasant side effects which may include dry mouth, dizziness or visual disturbances. 3. Avoid touching the patch. Wash your hands with soap and water after contact with the patch.   1 - You may have urinary urgency (bladder spasms) and bloody urine on / off x several days.   2 - Call MD or go to ER for fever >102, severe pain / nausea / vomiting not relieved by medications, or acute change in medical status

## 2016-10-27 NOTE — H&P (Signed)
Peter Martinez is an 66 y.o. male.    Chief Complaint: Pre-op Brachytherapy Seed Implantation  HPI:   1 - Moderate Risk Prostate Cancer - Gleason 3+4=7 in LMB, RMA and GLeason 6 in LMM, LMA, RLA by biopsy 05/2016 on eval rising PSA to 4.9. TRUS 66mL, no median lobe.   PMH sig for mixed connective tissue dissease (methotrexatone, no steroids). NO chest / abd surgeries. NO strong blood thinners. His PCP is Asencion Noble MD.   Today "Peter Martinez" is seen to proceed with brachytherapy seed implantation in primary management of his prostate cancer. No interval fevers.    Past Medical History:  Diagnosis Date  . Arthritis    psoriatic  . Diabetes mellitus   . Gout   . H/O mixed connective tissue disease   . Hypertension   . Kidney stones   . Wears glasses   . Wears partial dentures    upper    Past Surgical History:  Procedure Laterality Date  . TONSILLECTOMY      History reviewed. No pertinent family history. Social History:  reports that he has never smoked. He has never used smokeless tobacco. He reports that he does not drink alcohol or use drugs.  Allergies: No Known Allergies  No prescriptions prior to admission.    No results found for this or any previous visit (from the past 48 hour(s)). No results found.  Review of Systems  Constitutional: Negative.   HENT: Negative.   Eyes: Negative.   Respiratory: Negative.   Cardiovascular: Negative.   Gastrointestinal: Negative.   Genitourinary: Negative.   Musculoskeletal: Positive for myalgias.  Skin: Negative.   Neurological: Negative.   Endo/Heme/Allergies: Negative.   Psychiatric/Behavioral: Negative.     Height 6\' 4"  (1.93 m), weight 106.6 kg (235 lb). Physical Exam  Constitutional: He appears well-developed.  HENT:  Head: Normocephalic.  Eyes: Pupils are equal, round, and reactive to light.  Neck: Normal range of motion.  Cardiovascular: Normal rate.   Respiratory: Effort normal.  GI: Soft.  Genitourinary:   Genitourinary Comments: No CVAT.   Musculoskeletal: Normal range of motion.  Neurological: He is alert.  Skin: Skin is warm.  Psychiatric: He has a normal mood and affect. His behavior is normal. Judgment and thought content normal.     Assessment/Plan  1 - Moderate Risk Prostate Cancer - proceed as planned with brachytherapy seed implantation + cysto today. Risks, benefits, alternatives, expected peri-op course, need for further PSA monitoring discussed previously and reiterated today.   Alexis Frock, MD 10/27/2016, 6:34 AM

## 2016-10-27 NOTE — Anesthesia Postprocedure Evaluation (Signed)
Anesthesia Post Note  Patient: Peter Martinez  Procedure(s) Performed: Procedure(s) (LRB): RADIOACTIVE SEED IMPLANT/BRACHYTHERAPY IMPLANT (N/A)  Patient location during evaluation: PACU Anesthesia Type: General Level of consciousness: awake and alert Pain management: pain level controlled Vital Signs Assessment: post-procedure vital signs reviewed and stable Respiratory status: spontaneous breathing, nonlabored ventilation, respiratory function stable and patient connected to nasal cannula oxygen Cardiovascular status: blood pressure returned to baseline and stable Postop Assessment: no signs of nausea or vomiting Anesthetic complications: no       Last Vitals:  Vitals:   10/27/16 1345 10/27/16 1457  BP: 127/80 133/81  Pulse: (!) 56 (!) 58  Resp: 14 16  Temp:  36.7 C    Last Pain:  Vitals:   10/27/16 0929  TempSrc: Oral                 Heena Woodbury DAVID

## 2016-10-27 NOTE — Op Note (Signed)
Peter Martinez, SPAINHOUR NO.:  [id number]  MEDICAL RECORD NO.:  [id number]  LOCATION:                                 FACILITY:  PHYSICIAN:  Alexis Frock, MD     DATE OF BIRTH:  [birth year]  DATE OF PROCEDURE: 10/27/2016                               OPERATIVE REPORT   DIAGNOSIS:  Moderate risk prostate cancer.  PROCEDURE: 1. Implantation of brachytherapy seed. 2. Cystoscopy.  ESTIMATED BLOOD LOSS:  Nil.  COMPLICATION:  None.  SPECIMEN: 1. None.  FINDINGS: 1. Excellent delivery of 73 seeds per prescribed dose 145 Gy, #24     catheters. 2. Two spacers in bladder on final cystoscopy, no radiation, seeds in     the bladder. 3. Retrieval of both spacers with cystoscopy.  INDICATION:  [name] is a very pleasant 66 year old gentleman, who was found on workup of elevated PSA to have multifocal moderate risk adenocarcinoma of the prostate.  His prostate is not enlarged.  He has minimal voiding symptoms.  He is quite vigorous and very active.  He works several jobs.  Options were discussed for management including surveillance protocols versus ablative therapies versus surgical extirpation, and after discussion with ourselves and Radiation Oncology, the patient wished proceed with brachytherapy implantation.  He has suitable tumor and anatomic parameters for this and on consultation with Dr. Tyler Pita with Radiation Oncology, we both agreed he was a suitable candidate.  Informed consent was obtained and placed in the medical record.  PROCEDURE IN DETAIL:  The patient being [name] was verified. Procedure being brachytherapy seed implantation was confirmed. Procedure was carried out.  Time-out was performed.  Intravenous antibiotics were administered.  General LMA anesthesia was induced.  The patient was placed to a high lithotomy position.  Sterile field was created by prepping and draping the patient's perineum after Foley catheter was  placed per urethra to straight drain with contrast in the balloon.  Under Ultrasound guidance, 2 stay needles were placed and the symmetry and planning performed as per separate note.  Following adequate symmetry and planning, 24 needles containing 73 seeds were placed according to the plan and prescribed dose, prenatal ultrasound apparatus was taken down.  Flexible cystoscopy was then performed.  Flexible cystoscopy revealed 2 spacers, which were within the urinary bladder.  There was no evidence of brachytherapy seeds. This likely represented retrograde displacement from the prostatic urethra.  Given the orientation, a ZeroTip basket was used and these were sequentially grasped, removed, set aside for discard.  The bladder was partially emptied per catheter and procedure terminated.  The patient tolerated procedure well.  There were no immediate periprocedural complications.  The patient was taken to the postanesthesia care in stable condition.          ______________________________ Alexis Frock, MD     TM/MEDQ  D:  10/27/2016  T:  10/27/2016  Job:  [id number]

## 2016-10-27 NOTE — Transfer of Care (Signed)
Immediate Anesthesia Transfer of Care Note  Patient: Peter Martinez  Procedure(s) Performed: Procedure(s): RADIOACTIVE SEED IMPLANT/BRACHYTHERAPY IMPLANT (N/A)  Patient Location: PACU  Anesthesia Type:General  Level of Consciousness: awake, alert  and oriented  Airway & Oxygen Therapy: Patient Spontanous Breathing and Patient connected to face mask oxygen  Post-op Assessment: Report given to RN and Post -op Vital signs reviewed and stable  Post vital signs: Reviewed and stable  Last Vitals:  Vitals:   10/27/16 0929 10/27/16 1330  BP: (!) 142/89 (P) 138/83  Pulse: 61   Resp: 16 (P) 16  Temp: 36.6 C (!) (P) 36.1 C    Last Pain:  Vitals:   10/27/16 0929  TempSrc: Oral      Patients Stated Pain Goal: 7 (33/35/45 6256)  Complications: No apparent anesthesia complications

## 2016-10-27 NOTE — Op Note (Deleted)
  The note originally documented on this encounter has been moved the the encounter in which it belongs.  

## 2016-10-27 NOTE — Anesthesia Procedure Notes (Signed)
Procedure Name: Intubation Date/Time: 10/27/2016 11:19 AM Performed by: Rayvon Char Pre-anesthesia Checklist: Patient identified, Emergency Drugs available and Suction available Patient Re-evaluated:Patient Re-evaluated prior to inductionOxygen Delivery Method: Circle system utilized Preoxygenation: Pre-oxygenation with 100% oxygen Intubation Type: IV induction Ventilation: Mask ventilation without difficulty Laryngoscope Size: Miller and 2 Grade View: Grade II Number of attempts: 1 Airway Equipment and Method: Stylet Placement Confirmation: ETT inserted through vocal cords under direct vision,  positive ETCO2 and breath sounds checked- equal and bilateral Secured at: 22 cm Tube secured with: Tape Dental Injury: Teeth and Oropharynx as per pre-operative assessment

## 2016-10-31 ENCOUNTER — Encounter (HOSPITAL_BASED_OUTPATIENT_CLINIC_OR_DEPARTMENT_OTHER): Payer: Self-pay | Admitting: Urology

## 2016-11-02 NOTE — Addendum Note (Signed)
Addendum  created 11/02/16 2037 by Lillia Abed, MD   Anesthesia Staff edited

## 2016-11-07 NOTE — Addendum Note (Signed)
Addendum  created 11/07/16 1001 by Lillia Abed, MD   Anesthesia Staff edited

## 2016-11-22 ENCOUNTER — Telehealth: Payer: Self-pay | Admitting: *Deleted

## 2016-11-22 NOTE — Telephone Encounter (Signed)
CALLED PATIENT TO REMIND OF POST SEED APPTS. FOR 11-23-16, NO ANSWER

## 2016-11-23 ENCOUNTER — Ambulatory Visit
Admission: RE | Admit: 2016-11-23 | Discharge: 2016-11-23 | Disposition: A | Discharge status: Home / Self Care | Payer: 59 | Source: Ambulatory Visit | Attending: Radiation Oncology | Admitting: Radiation Oncology

## 2016-11-23 ENCOUNTER — Encounter: Payer: Self-pay | Admitting: Radiation Oncology

## 2016-11-23 VITALS — BP 136/95 | HR 68 | Temp 98.1°F | Resp 18 | Ht 76.0 in | Wt 217.0 lb

## 2016-11-23 DIAGNOSIS — C61 Malignant neoplasm of prostate: Secondary | ICD-10-CM

## 2016-11-23 NOTE — Progress Notes (Signed)
Radiation Oncology         [phone] ________________________________  Name: Peter Martinez MRN: [id number]  Date: 11/23/2016  DOB: 1951/03/16  Post Treatment Note  CC: Asencion Noble MD  Asencion Noble, MD  Diagnosis:   T1c prostate cancer, Gleason 3+4 and PSA 4.9.  Interval Since Last Radiation: 4 weeks  Insertion of radioactive I-125 seeds into the prostate gland on 10/27/16.  RADIATION DOSE: 145 Gy, definitive therapy.   Narrative:  The patient returns today for routine follow-up.  He tolerated the procedure well with minimal urinary irritation and mild fatigue.                             On review of systems, the patient states that he is doing well and is without complaints.  He continues with nocturia x1 which was present prior to his procedure.  He has mild dysuria at the end of his stream on occasion.  He denies gross hematuria, urgency, frequency, weak stream or hesitancy.  His bowel movements are normal and he denies N/V/D or abdominal pain. He denies recent fever, chills or night sweats.  His appetite is good and he is maintaining his weight. He has a follow up appointment with Dr. Tresa Moore on 01/29/17.  ALLERGIES:  has No Known Allergies.  Meds: Current Outpatient Prescriptions  Medication Sig Dispense Refill  . acetaminophen (TYLENOL) 500 MG tablet Take 1,000 mg by mouth as needed.    Marland Kitchen allopurinol (ZYLOPRIM) 300 MG tablet Take 300 mg by mouth daily.    Marland Kitchen aspirin EC 81 MG tablet Take 81 mg by mouth daily.    . cholecalciferol (VITAMIN D) 1000 units tablet Take 5,000 Units by mouth at bedtime.    . colchicine 0.6 MG tablet One tab po q 2 hrs until pain relief or stomach upset 12 tablet 0  . Cyanocobalamin (B-12) 2500 MCG TABS Take 2,500 mcg by mouth daily.    . folic acid (FOLVITE) 1 MG tablet Take 1 mg by mouth daily.    . hydroxychloroquine (PLAQUENIL) 200 MG tablet Take 200 mg by mouth 2 (two) times daily.    Marland Kitchen losartan (COZAAR) 100 MG tablet Take 100 mg by mouth at  bedtime.     . metFORMIN (GLUCOPHAGE) 1000 MG tablet Take 1,000 mg by mouth 2 (two) times daily with a meal.    . methotrexate (RHEUMATREX) 2.5 MG tablet Take 15 mg by mouth once a week. On SUNDAYS    . metoprolol (LOPRESSOR) 100 MG tablet Take 50 mg by mouth 2 (two) times daily.     . nabumetone (RELAFEN) 750 MG tablet Take 750 mg by mouth 2 (two) times daily.    . simvastatin (ZOCOR) 10 MG tablet Take 10 mg by mouth at bedtime.     . fenofibrate 160 MG tablet Take 160 mg by mouth daily.    . traMADol (ULTRAM) 50 MG tablet Take 1-2 tablets (50-100 mg total) by mouth every 6 (six) hours as needed for moderate pain. Post-operatively (Patient not taking: Reported on 11/23/2016) 20 tablet 0   No current facility-administered medications for this encounter.     Physical Findings:  height is 6\' 4"  (1.93 m) and weight is 217 lb (98.4 kg). His oral temperature is 98.1 F (36.7 C). His blood pressure is 136/95 (abnormal) and his pulse is 68. His respiration is 18 and oxygen saturation is 100%.  Pain Assessment Pain Score: 0-No pain/10 In general  this is a well appearing caucasian male in no acute distress. He's alert and oriented x4 and appropriate throughout the examination. Cardiopulmonary assessment is negative for acute distress and he exhibits normal effort.   Lab Findings: Lab Results  Component Value Date   WBC 9.0 10/20/2016   HGB 14.3 10/20/2016   HCT 43.2 10/20/2016   MCV 92.5 10/20/2016   PLT 308 10/20/2016     Radiographic Findings: Dg C-arm 1-60 Min-no Report  Result Date: 10/27/2016 Fluoroscopy was utilized by the requesting physician.  No radiographic interpretation.    Impression/Plan: 1.  T1c prostate cancer, Gleason 3+4 and PSA 4.9. The patient is recovering well from the effects radiation. He is reassured that his urinary symptoms should continue to gradually improve over the next 4-6 months. He is encouraged by his improvement already and is otherwise pleased with his  outcome.  Today we spent time talking to the patient about his prostate seed implant and resolving urinary symptoms. We also talked about long-term follow-up of prostate cancer following seed implant. He understands that ongoing PSA determinations and digital rectal exams will help perform surveillance to rule out disease recurrence. He understands what to expect with his PSA measures going forward. He was also educated about some of the long-term effects from radiation including a small risk for rectal bleeding and possible erectile dysfunction. We talked about some of the general management approaches to these potential complications.He is encouraged to contact our office or return at any point if he has any questions or concerns related to his previous radiation or prostate cancer. We look forward to continuing to follow his response to treatment through urology correspondence and are happy to see him back as needed.    Nicholos Johns, PA-C

## 2016-11-23 NOTE — Progress Notes (Signed)
Weight and vital signs are stable.  Here for post seed appointment.   Denies urinary urgency or frequency, hematuria, straining.  Reports a weak urinary stream at times. Nocturia x 1.  Reports fatigue has gotten much better since surghery. Appetite is good. Wt Readings from Last 3 Encounters:  11/23/16 217 lb (98.4 kg)  10/27/16 218 lb 8 oz (99.1 kg)  02/28/12 285 lb (129.3 kg)  BP (!) 136/95   Pulse 68   Temp 98.1 F (36.7 C) (Oral)   Resp 18   Ht 6\' 4"  (1.93 m)   Wt 217 lb (98.4 kg)   SpO2 100%   BMI 26.41 kg/m

## 2016-11-23 NOTE — Addendum Note (Signed)
Encounter addended by: Heywood Footman, RN on: 11/23/2016  2:09 PM<BR>    Actions taken: Charge Capture section accepted

## 2016-11-26 NOTE — Progress Notes (Signed)
  Radiation Oncology         [phone] ________________________________  Name: Peter Martinez MRN: [id number]  Date: 11/23/2016  DOB: [birth year]  COMPLEX SIMULATION NOTE  NARRATIVE:  The patient was brought to the [facility] today following prostate seed implantation approximately one month ago.  Identity was confirmed.  All relevant records and images related to the planned course of therapy were reviewed.  Then, the patient was set-up supine.  CT images were obtained.  The CT images were loaded into the planning software.  Then the prostate and rectum were contoured.  Treatment planning then occurred.  The implanted iodine 125 seeds were identified by the physics staff for projection of radiation distribution  I have requested : 3D Simulation  I have requested a DVH of the following structures: Prostate and rectum.    ________________________________  Sheral Apley Tammi Klippel, M.D.

## 2016-12-06 ENCOUNTER — Encounter: Payer: Self-pay | Admitting: Radiation Oncology

## 2016-12-06 DIAGNOSIS — C61 Malignant neoplasm of prostate: Secondary | ICD-10-CM | POA: Diagnosis not present

## 2016-12-10 NOTE — Progress Notes (Signed)
  Radiation Oncology         [phone] ________________________________  Name: Peter Martinez MRN: [id number]  Date: 12/06/2016  DOB: [birth year]  3D Planning Note   Prostate Brachytherapy Post-Implant Dosimetry  Diagnosis: 66 yo man with T1c prostate cancer, Gleason 3+4 and PSA 4.9  Narrative: On a previous date, Peter Martinez returned following prostate seed implantation for post implant planning. He underwent CT scan complex simulation to delineate the three-dimensional structures of the pelvis and demonstrate the radiation distribution.  Since that time, the seed localization, and complex isodose planning with dose volume histograms have now been completed.  Results:   Prostate Coverage - The dose of radiation delivered to the 90% or more of the prostate gland (D90) was 121.83% of the prescription dose. This exceeds our goal of greater than 90%. Rectal Sparing - The volume of rectal tissue receiving the prescription dose or higher was 0.03 cc. This falls under our thresholds tolerance of 1.0 cc.  Impression: The prostate seed implant appears to show adequate target coverage and appropriate rectal sparing.  Plan:  The patient will continue to follow with urology for ongoing PSA determinations. I would anticipate a high likelihood for local tumor control with minimal risk for rectal morbidity.  ________________________________  Sheral Apley Tammi Klippel, M.D.

## 2017-04-24 ENCOUNTER — Ambulatory Visit (INDEPENDENT_AMBULATORY_CARE_PROVIDER_SITE_OTHER): Payer: 59 | Admitting: General Surgery

## 2017-04-24 ENCOUNTER — Encounter: Payer: Self-pay | Admitting: General Surgery

## 2017-04-24 VITALS — BP 157/95 | HR 65 | Temp 98.0°F | Resp 18 | Ht 76.0 in | Wt 220.0 lb

## 2017-04-24 DIAGNOSIS — L089 Local infection of the skin and subcutaneous tissue, unspecified: Secondary | ICD-10-CM | POA: Diagnosis not present

## 2017-04-24 DIAGNOSIS — L723 Sebaceous cyst: Secondary | ICD-10-CM

## 2017-04-24 NOTE — Progress Notes (Signed)
Peter Martinez; [id number]; 1951-05-20   HPI   Patient is a 66 year old white male who was referred by care of by Dr. Willey Blade for evaluation and treatment of an enlarging cyst on his scalp.  His present for many years, but recently has increased in size and is causing tenderness and soreness.  He is also noted that the skin is red over.  He currently has no pain unless it is palpated.  No drainage has been noted.  He denies any fevers. Past Medical History:  Diagnosis Date  . Arthritis    psoriatic  . Diabetes mellitus   . Gout   . H/O mixed connective tissue disease   . Hypertension   . Kidney stones   . Wears glasses   . Wears partial dentures    upper    Past Surgical History:  Procedure Laterality Date  . RADIOACTIVE SEED IMPLANT N/A 10/27/2016   Procedure: RADIOACTIVE SEED IMPLANT/BRACHYTHERAPY IMPLANT;  Surgeon: Alexis Frock, MD;  Location: [facility];  Service: Urology;  Laterality: N/A;  . TONSILLECTOMY      No family history on file.  Current Outpatient Prescriptions on File Prior to Visit  Medication Sig Dispense Refill  . acetaminophen (TYLENOL) 500 MG tablet Take 1,000 mg by mouth as needed.    Marland Kitchen allopurinol (ZYLOPRIM) 300 MG tablet Take 300 mg by mouth daily.    Marland Kitchen aspirin EC 81 MG tablet Take 81 mg by mouth daily.    . cholecalciferol (VITAMIN D) 1000 units tablet Take 5,000 Units by mouth at bedtime.    . colchicine 0.6 MG tablet One tab po q 2 hrs until pain relief or stomach upset 12 tablet 0  . Cyanocobalamin (B-12) 2500 MCG TABS Take 2,500 mcg by mouth daily.    . fenofibrate 160 MG tablet Take 160 mg by mouth daily.    . folic acid (FOLVITE) 1 MG tablet Take 1 mg by mouth daily.    . hydroxychloroquine (PLAQUENIL) 200 MG tablet Take 200 mg by mouth 2 (two) times daily.    Marland Kitchen losartan (COZAAR) 100 MG tablet Take 100 mg by mouth at bedtime.     . metFORMIN (GLUCOPHAGE) 1000 MG tablet Take 1,000 mg by mouth 2 (two) times daily with a meal.    .  methotrexate (RHEUMATREX) 2.5 MG tablet Take 15 mg by mouth once a week. On SUNDAYS    . metoprolol (LOPRESSOR) 100 MG tablet Take 50 mg by mouth 2 (two) times daily.     . nabumetone (RELAFEN) 750 MG tablet Take 750 mg by mouth 2 (two) times daily.    . simvastatin (ZOCOR) 10 MG tablet Take 10 mg by mouth at bedtime.     . traMADol (ULTRAM) 50 MG tablet Take 1-2 tablets (50-100 mg total) by mouth every 6 (six) hours as needed for moderate pain. Post-operatively (Patient not taking: Reported on 11/23/2016) 20 tablet 0   No current facility-administered medications on file prior to visit.     No Known Allergies  History  Alcohol Use No    History  Smoking Status  . Never Smoker  Smokeless Tobacco  . Never Used    Review of Systems  Constitutional: Positive for malaise/fatigue.  HENT: Negative.   Eyes: Positive for blurred vision.  Respiratory: Negative.   Cardiovascular: Negative.   Gastrointestinal: Negative.   Genitourinary: Positive for dysuria and frequency.  Musculoskeletal: Positive for joint pain.  Neurological: Negative.   Endo/Heme/Allergies: Negative.   Psychiatric/Behavioral: Negative.  Objective   Vitals:   04/24/17 1526  BP: (!) 157/95  Pulse: 65  Resp: 18  Temp: 98 F (36.7 C)    Physical Exam  Constitutional: He is oriented to person, place, and time and well-developed, well-nourished, and in no distress.  HENT:  Head: Normocephalic and atraumatic.  3 x 4 cm large tender sebaceous cyst along the top portion of his scalp with very thin skin present.  Mild erythema is noted.  Cardiovascular: Normal rate, regular rhythm and normal heart sounds.  Exam reveals no friction rub.   No murmur heard. Pulmonary/Chest: Effort normal and breath sounds normal. No respiratory distress. He has no wheezes.  Neurological: He is alert and oriented to person, place, and time.  Vitals reviewed.    Dr. Ria Comment notes reviewed Assessment   Enflamed/inected  sebaceous cyst, scalp Plan    patient is scheduled for excision of the sebaceous cyst, scalp on 05/11/2017.  Risks and benefits of the procedure including bleeding, infection, recurrence of the cyst were fully explained to the patient,

## 2017-04-24 NOTE — Patient Instructions (Signed)
Epidermal Cyst An epidermal cyst is a small, painless lump under your skin. It may be called an epidermal inclusion cyst or an infundibular cyst. The cyst contains a grayish-white, bad-smelling substance (keratin). It is important not to pop epidermal cysts yourself. These cysts are usually harmless (benign), but they can get infected. Symptoms of infection may include:  Redness.  Inflammation.  Tenderness.  Warmth.  Fever.  A grayish-white, bad-smelling substance draining from the cyst.  Pus draining from the cyst.  Follow these instructions at home:  Take over-the-counter and prescription medicines only as told by your doctor.  If you were prescribed an antibiotic, use it as told by your doctor. Do not stop using the antibiotic even if you start to feel better.  Keep the area around your cyst clean and dry.  Wear loose, dry clothing.  Do not try to pop your cyst.  Avoid touching your cyst.  Check your cyst every day for signs of infection.  Keep all follow-up visits as told by your doctor. This is important. How is this prevented?  Wear clean, dry, clothing.  Avoid wearing tight clothing.  Keep your skin clean and dry. Shower or take baths every day.  Wash your body with a benzoyl peroxide wash when you shower or bathe. Contact a health care provider if:  Your cyst has symptoms of infection.  Your condition is not improving or is getting worse.  You have a cyst that looks different from other cysts you have had.  You have a fever. Get help right away if:  Redness spreads from the cyst into the surrounding area. This information is not intended to replace advice given to you by your health care provider. Make sure you discuss any questions you have with your health care provider. Document Released: 09/07/2004 Document Revised: 03/29/2016 Document Reviewed: 06/02/2015 Elsevier Interactive Patient Education  2018 [employer]  

## 2017-04-24 NOTE — H&P (Signed)
Peter Martinez; [id number]; Apr 21, 1951   HPI   Patient is a 66 year old white male who was referred by care of by Dr. Willey Blade for evaluation and treatment of an enlarging cyst on his scalp.  His present for many years, but recently has increased in size and is causing tenderness and soreness.  He is also noted that the skin is red over.  He currently has no pain unless it is palpated.  No drainage has been noted.  He denies any fevers. Past Medical History:  Diagnosis Date  . Arthritis    psoriatic  . Diabetes mellitus   . Gout   . H/O mixed connective tissue disease   . Hypertension   . Kidney stones   . Wears glasses   . Wears partial dentures    upper    Past Surgical History:  Procedure Laterality Date  . RADIOACTIVE SEED IMPLANT N/A 10/27/2016   Procedure: RADIOACTIVE SEED IMPLANT/BRACHYTHERAPY IMPLANT;  Surgeon: Alexis Frock, MD;  Location: [facility];  Service: Urology;  Laterality: N/A;  . TONSILLECTOMY      No family history on file.  Current Outpatient Prescriptions on File Prior to Visit  Medication Sig Dispense Refill  . acetaminophen (TYLENOL) 500 MG tablet Take 1,000 mg by mouth as needed.    Marland Kitchen allopurinol (ZYLOPRIM) 300 MG tablet Take 300 mg by mouth daily.    Marland Kitchen aspirin EC 81 MG tablet Take 81 mg by mouth daily.    . cholecalciferol (VITAMIN D) 1000 units tablet Take 5,000 Units by mouth at bedtime.    . colchicine 0.6 MG tablet One tab po q 2 hrs until pain relief or stomach upset 12 tablet 0  . Cyanocobalamin (B-12) 2500 MCG TABS Take 2,500 mcg by mouth daily.    . fenofibrate 160 MG tablet Take 160 mg by mouth daily.    . folic acid (FOLVITE) 1 MG tablet Take 1 mg by mouth daily.    . hydroxychloroquine (PLAQUENIL) 200 MG tablet Take 200 mg by mouth 2 (two) times daily.    Marland Kitchen losartan (COZAAR) 100 MG tablet Take 100 mg by mouth at bedtime.     . metFORMIN (GLUCOPHAGE) 1000 MG tablet Take 1,000 mg by mouth 2 (two) times daily with a meal.    .  methotrexate (RHEUMATREX) 2.5 MG tablet Take 15 mg by mouth once a week. On SUNDAYS    . metoprolol (LOPRESSOR) 100 MG tablet Take 50 mg by mouth 2 (two) times daily.     . nabumetone (RELAFEN) 750 MG tablet Take 750 mg by mouth 2 (two) times daily.    . simvastatin (ZOCOR) 10 MG tablet Take 10 mg by mouth at bedtime.     . traMADol (ULTRAM) 50 MG tablet Take 1-2 tablets (50-100 mg total) by mouth every 6 (six) hours as needed for moderate pain. Post-operatively (Patient not taking: Reported on 11/23/2016) 20 tablet 0   No current facility-administered medications on file prior to visit.     No Known Allergies  History  Alcohol Use No    History  Smoking Status  . Never Smoker  Smokeless Tobacco  . Never Used    Review of Systems  Constitutional: Positive for malaise/fatigue.  HENT: Negative.   Eyes: Positive for blurred vision.  Respiratory: Negative.   Cardiovascular: Negative.   Gastrointestinal: Negative.   Genitourinary: Positive for dysuria and frequency.  Musculoskeletal: Positive for joint pain.  Neurological: Negative.   Endo/Heme/Allergies: Negative.   Psychiatric/Behavioral: Negative.  Objective   Vitals:   04/24/17 1526  BP: (!) 157/95  Pulse: 65  Resp: 18  Temp: 98 F (36.7 C)    Physical Exam  Constitutional: He is oriented to person, place, and time and well-developed, well-nourished, and in no distress.  HENT:  Head: Normocephalic and atraumatic.  3 x 4 cm large tender sebaceous cyst along the top portion of his scalp with very thin skin present.  Mild erythema is noted.  Cardiovascular: Normal rate, regular rhythm and normal heart sounds.  Exam reveals no friction rub.   No murmur heard. Pulmonary/Chest: Effort normal and breath sounds normal. No respiratory distress. He has no wheezes.  Neurological: He is alert and oriented to person, place, and time.  Vitals reviewed.    Dr. Ria Comment notes reviewed Assessment   Enflamed/inected  sebaceous cyst, scalp Plan    patient is scheduled for excision of the sebaceous cyst, scalp on 05/11/2017.  Risks and benefits of the procedure including bleeding, infection, recurrence of the cyst were fully explained to the patient,

## 2017-04-26 ENCOUNTER — Ambulatory Visit: Payer: 59 | Admitting: General Surgery

## 2017-05-03 NOTE — Patient Instructions (Signed)
Your procedure is scheduled on: 05/11/2017  Report to [facility] at 8:00    AM.  Call this number if you have problems the morning of surgery: [phone]   Remember:   Do not drink or eat food:After Midnight.  :  Take these medicines the morning of surgery with A SIP OF WATER: Metoprolol, losartan, Hydroxychloroquine, and tramadol if needed   Do not wear jewelry, make-up or nail polish.  Do not wear lotions, powders, or perfumes. You may wear deodorant.  Do not shave 48 hours prior to surgery. Men may shave face and neck.  Do not bring valuables to the hospital.  Contacts, dentures or bridgework may not be worn into surgery.  Leave suitcase in the car. After surgery it may be brought to your room.  For patients admitted to the hospital, checkout time is 11:00 AM the day of discharge.   Patients discharged the day of surgery will not be allowed to drive home.    Special Instructions: Shower using CHG night before surgery and shower the day of surgery use CHG.  Use special wash - you have one bottle of CHG for all showers.  You should use approximately 1/2 of the bottle for each shower. Excision of scalp cyst, Care After Refer to this sheet in the next few weeks. These instructions provide you with information about caring for yourself after your procedure. Your health care provider may also give you more specific instructions. Your treatment has been planned according to current medical practices, but problems sometimes occur. Call your health care provider if you have any problems or questions after your procedure. What can I expect after the procedure? After your procedure, it is common to have pain or discomfort at the excision site. Follow these instructions at home:  Take over-the-counter and prescription medicines only as told by your health care provider.  Follow instructions from your health care provider about: ? How to take care of your excision site. You should keep the  site clean, dry, and protected for at least 48 hours. ? When and how you should change your bandage (dressing). ? When you should remove your dressing. ? Removing whatever was used to close your excision site.  Check the excision area every day for signs of infection. Watch for: ? Redness, swelling, or pain. ? Fluid, blood, or pus.  For bleeding, apply gentle but firm pressure to the area using a folded towel for 20 minutes.  Avoid high-impact exercise and activities until the stitches (sutures) are removed or the area heals.  Follow instructions from your health care provider about how to minimize scarring. Avoid sun exposure until the area has healed. Scarring should lessen over time.  Keep all follow-up visits as told by your health care provider. This is important. Contact a health care provider if:  You have a fever.  You have redness, swelling, or pain at the excision site.  You have fluid, blood, or pus coming from the excision site.  You have ongoing bleeding at the excision site.  You have pain that does not improve in 2-3 days after your procedure.  You notice skin irregularities or changes in sensation. This information is not intended to replace advice given to you by your health care provider. Make sure you discuss any questions you have with your health care provider. Document Released: 12/15/2014 Document Revised: 01/06/2016 Document Reviewed: 09/16/2014 Elsevier Interactive Patient Education  2018 [facility], Adult, Care After These instructions provide you  with information about caring for yourself after your procedure. Your health care provider may also give you more specific instructions. Your treatment has been planned according to current medical practices, but problems sometimes occur. Call your health care provider if you have any problems or questions after your procedure. What can I expect after the procedure? After the procedure, it  is common to have:  Vomiting.  A sore throat.  Mental slowness.  It is common to feel:  Nauseous.  Cold or shivery.  Sleepy.  Tired.  Sore or achy, even in parts of your body where you did not have surgery.  Follow these instructions at home: For at least 24 hours after the procedure:  Do not: ? Participate in activities where you could fall or become injured. ? Drive. ? Use heavy machinery. ? Drink alcohol. ? Take sleeping pills or medicines that cause drowsiness. ? Make important decisions or sign legal documents. ? Take care of children on your own.  Rest. Eating and drinking  If you vomit, drink water, juice, or soup when you can drink without vomiting.  Drink enough fluid to keep your urine clear or pale yellow.  Make sure you have little or no nausea before eating solid foods.  Follow the diet recommended by your health care provider. General instructions  Have a responsible adult stay with you until you are awake and alert.  Return to your normal activities as told by your health care provider. Ask your health care provider what activities are safe for you.  Take over-the-counter and prescription medicines only as told by your health care provider.  If you smoke, do not smoke without supervision.  Keep all follow-up visits as told by your health care provider. This is important. Contact a health care provider if:  You continue to have nausea or vomiting at home, and medicines are not helpful.  You cannot drink fluids or start eating again.  You cannot urinate after 8-12 hours.  You develop a skin rash.  You have fever.  You have increasing redness at the site of your procedure. Get help right away if:  You have difficulty breathing.  You have chest pain.  You have unexpected bleeding.  You feel that you are having a life-threatening or urgent problem. This information is not intended to replace advice given to you by your health care  provider. Make sure you discuss any questions you have with your health care provider. Document Released: 11/06/2000 Document Revised: 01/03/2016 Document Reviewed: 07/15/2015 Elsevier Interactive Patient Education  [employer].

## 2017-05-07 ENCOUNTER — Encounter (HOSPITAL_COMMUNITY): Payer: Self-pay

## 2017-05-07 ENCOUNTER — Encounter (HOSPITAL_COMMUNITY)
Admission: RE | Admit: 2017-05-07 | Discharge: 2017-05-07 | Disposition: A | Discharge status: Home / Self Care | Payer: 59 | Source: Ambulatory Visit | Attending: General Surgery | Admitting: General Surgery

## 2017-05-07 DIAGNOSIS — Z79891 Long term (current) use of opiate analgesic: Secondary | ICD-10-CM | POA: Insufficient documentation

## 2017-05-07 DIAGNOSIS — L728 Other follicular cysts of the skin and subcutaneous tissue: Secondary | ICD-10-CM | POA: Diagnosis not present

## 2017-05-07 DIAGNOSIS — E119 Type 2 diabetes mellitus without complications: Secondary | ICD-10-CM | POA: Insufficient documentation

## 2017-05-07 DIAGNOSIS — Z7982 Long term (current) use of aspirin: Secondary | ICD-10-CM | POA: Insufficient documentation

## 2017-05-07 DIAGNOSIS — I10 Essential (primary) hypertension: Secondary | ICD-10-CM | POA: Diagnosis not present

## 2017-05-07 DIAGNOSIS — L4050 Arthropathic psoriasis, unspecified: Secondary | ICD-10-CM | POA: Diagnosis not present

## 2017-05-07 DIAGNOSIS — Z01812 Encounter for preprocedural laboratory examination: Secondary | ICD-10-CM | POA: Insufficient documentation

## 2017-05-07 DIAGNOSIS — Z87442 Personal history of urinary calculi: Secondary | ICD-10-CM | POA: Diagnosis not present

## 2017-05-07 DIAGNOSIS — Z9889 Other specified postprocedural states: Secondary | ICD-10-CM | POA: Diagnosis not present

## 2017-05-07 DIAGNOSIS — Z79899 Other long term (current) drug therapy: Secondary | ICD-10-CM | POA: Insufficient documentation

## 2017-05-07 DIAGNOSIS — M109 Gout, unspecified: Secondary | ICD-10-CM | POA: Insufficient documentation

## 2017-05-07 HISTORY — DX: Malignant (primary) neoplasm, unspecified: C80.1

## 2017-05-07 HISTORY — DX: Personal history of urinary calculi: Z87.442

## 2017-05-07 HISTORY — DX: Anemia, unspecified: D64.9

## 2017-05-07 LAB — CBC WITH DIFFERENTIAL/PLATELET
BASOS PCT: 1 %
Basophils Absolute: 0 10*3/uL (ref 0.0–0.1)
EOS ABS: 0.2 10*3/uL (ref 0.0–0.7)
EOS PCT: 3 %
HCT: 42.7 % (ref 39.0–52.0)
HEMOGLOBIN: 14.1 g/dL (ref 13.0–17.0)
LYMPHS ABS: 1.4 10*3/uL (ref 0.7–4.0)
Lymphocytes Relative: 26 %
MCH: 32.2 pg (ref 26.0–34.0)
MCHC: 33 g/dL (ref 30.0–36.0)
MCV: 97.5 fL (ref 78.0–100.0)
Monocytes Absolute: 0.5 10*3/uL (ref 0.1–1.0)
Monocytes Relative: 10 %
NEUTROS PCT: 60 %
Neutro Abs: 3.2 10*3/uL (ref 1.7–7.7)
PLATELETS: 292 10*3/uL (ref 150–400)
RBC: 4.38 MIL/uL (ref 4.22–5.81)
RDW: 14.6 % (ref 11.5–15.5)
WBC: 5.3 10*3/uL (ref 4.0–10.5)

## 2017-05-07 LAB — BASIC METABOLIC PANEL
Anion gap: 8 (ref 5–15)
BUN: 14 mg/dL (ref 6–20)
CALCIUM: 9.4 mg/dL (ref 8.9–10.3)
CO2: 29 mmol/L (ref 22–32)
CREATININE: 1.13 mg/dL (ref 0.61–1.24)
Chloride: 101 mmol/L (ref 101–111)
GFR calc non Af Amer: >60 mL/min (ref >60)
Glucose, Bld: 95 mg/dL (ref 65–99)
Potassium: 4.7 mmol/L (ref 3.5–5.1)
SODIUM: 138 mmol/L (ref 135–145)

## 2017-05-07 NOTE — Progress Notes (Signed)
   05/07/17 1115  OBSTRUCTIVE SLEEP APNEA  Have you ever been diagnosed with sleep apnea through a sleep study? No  Do you snore loudly (loud enough to be heard through closed doors)?  1  Do you often feel tired, fatigued, or sleepy during the daytime (such as falling asleep during driving or talking to someone)? 0  Has anyone observed you stop breathing during your sleep? 1  Do you have, or are you being treated for high blood pressure? 1  BMI more than 35 kg/m2? 0  Age > 50 (1-yes) 1  Neck circumference greater than:Male 16 inches or larger, Male 17inches or larger? 0  Male Gender (Yes=1) 1  Obstructive Sleep Apnea Score 5  Score 5 or greater  Results sent to PCP

## 2017-05-11 ENCOUNTER — Encounter (HOSPITAL_COMMUNITY): Payer: Self-pay | Admitting: *Deleted

## 2017-05-11 ENCOUNTER — Ambulatory Visit (HOSPITAL_COMMUNITY): Payer: 59 | Admitting: Anesthesiology

## 2017-05-11 ENCOUNTER — Encounter (HOSPITAL_COMMUNITY)
Admission: RE | Disposition: A | Discharge status: Home / Self Care | Payer: Self-pay | Source: Ambulatory Visit | Attending: General Surgery

## 2017-05-11 ENCOUNTER — Ambulatory Visit (HOSPITAL_COMMUNITY)
Admission: RE | Admit: 2017-05-11 | Discharge: 2017-05-11 | Disposition: A | Discharge status: Home / Self Care | Payer: 59 | Source: Ambulatory Visit | Attending: General Surgery | Admitting: General Surgery

## 2017-05-11 DIAGNOSIS — L723 Sebaceous cyst: Secondary | ICD-10-CM | POA: Diagnosis present

## 2017-05-11 DIAGNOSIS — M109 Gout, unspecified: Secondary | ICD-10-CM | POA: Insufficient documentation

## 2017-05-11 DIAGNOSIS — Z87442 Personal history of urinary calculi: Secondary | ICD-10-CM | POA: Diagnosis not present

## 2017-05-11 DIAGNOSIS — M351 Other overlap syndromes: Secondary | ICD-10-CM | POA: Diagnosis not present

## 2017-05-11 DIAGNOSIS — E118 Type 2 diabetes mellitus with unspecified complications: Secondary | ICD-10-CM | POA: Diagnosis not present

## 2017-05-11 DIAGNOSIS — L4050 Arthropathic psoriasis, unspecified: Secondary | ICD-10-CM | POA: Insufficient documentation

## 2017-05-11 DIAGNOSIS — M1990 Unspecified osteoarthritis, unspecified site: Secondary | ICD-10-CM | POA: Diagnosis not present

## 2017-05-11 DIAGNOSIS — Z79899 Other long term (current) drug therapy: Secondary | ICD-10-CM | POA: Diagnosis not present

## 2017-05-11 DIAGNOSIS — I10 Essential (primary) hypertension: Secondary | ICD-10-CM | POA: Insufficient documentation

## 2017-05-11 DIAGNOSIS — L720 Epidermal cyst: Secondary | ICD-10-CM | POA: Diagnosis not present

## 2017-05-11 DIAGNOSIS — Z7984 Long term (current) use of oral hypoglycemic drugs: Secondary | ICD-10-CM | POA: Diagnosis not present

## 2017-05-11 HISTORY — PX: MASS EXCISION: SHX2000

## 2017-05-11 LAB — GLUCOSE, CAPILLARY
GLUCOSE-CAPILLARY: 75 mg/dL (ref 65–99)
GLUCOSE-CAPILLARY: 134 mg/dL — AB (ref 65–99)
GLUCOSE-CAPILLARY: 92 mg/dL (ref 65–99)

## 2017-05-11 SURGERY — EXCISION MASS
Anesthesia: Monitor Anesthesia Care

## 2017-05-11 MED ORDER — FENTANYL CITRATE (PF) 100 MCG/2ML IJ SOLN
INTRAMUSCULAR | Status: AC
  Filled 2017-05-11: qty 2

## 2017-05-11 MED ORDER — LACTATED RINGERS IV SOLN
INTRAVENOUS | Status: DC
  Administered 2017-05-11: 09:00:00 via INTRAVENOUS

## 2017-05-11 MED ORDER — PROPOFOL 500 MG/50ML IV EMUL
INTRAVENOUS | Status: DC | PRN
  Administered 2017-05-11: 75 ug/kg/min via INTRAVENOUS

## 2017-05-11 MED ORDER — LIDOCAINE HCL (PF) 1 % IJ SOLN
INTRAMUSCULAR | Status: AC
  Filled 2017-05-11: qty 5

## 2017-05-11 MED ORDER — KETOROLAC TROMETHAMINE 30 MG/ML IJ SOLN
30.0000 mg | Freq: Once | INTRAMUSCULAR | Status: AC
  Administered 2017-05-11: 30 mg via INTRAVENOUS
  Filled 2017-05-11: qty 1

## 2017-05-11 MED ORDER — 0.9 % SODIUM CHLORIDE (POUR BTL) OPTIME
TOPICAL | Status: DC | PRN
  Administered 2017-05-11: 1000 mL

## 2017-05-11 MED ORDER — MIDAZOLAM HCL 2 MG/2ML IJ SOLN
1.0000 mg | INTRAMUSCULAR | Status: AC
  Administered 2017-05-11: 2 mg via INTRAVENOUS
  Filled 2017-05-11: qty 2

## 2017-05-11 MED ORDER — DEXTROSE 50 % IV SOLN
12.5000 g | Freq: Once | INTRAVENOUS | Status: AC
  Administered 2017-05-11: 12.5 g via INTRAVENOUS

## 2017-05-11 MED ORDER — FENTANYL CITRATE (PF) 100 MCG/2ML IJ SOLN: 25.0000 ug | INTRAMUSCULAR | Status: DC | PRN

## 2017-05-11 MED ORDER — LIDOCAINE HCL (PF) 1 % IJ SOLN
INTRAMUSCULAR | Status: DC | PRN
  Administered 2017-05-11: 3 mL

## 2017-05-11 MED ORDER — BUPIVACAINE HCL (PF) 0.5 % IJ SOLN
INTRAMUSCULAR | Status: AC
  Filled 2017-05-11: qty 30

## 2017-05-11 MED ORDER — CHLORHEXIDINE GLUCONATE CLOTH 2 % EX PADS: 6.0000 | MEDICATED_PAD | Freq: Once | CUTANEOUS | Status: DC

## 2017-05-11 MED ORDER — POVIDONE-IODINE 10 % OINT PACKET
TOPICAL_OINTMENT | CUTANEOUS | Status: DC | PRN
  Administered 2017-05-11: 1 via TOPICAL

## 2017-05-11 MED ORDER — DEXTROSE 50 % IV SOLN
INTRAVENOUS | Status: AC
  Filled 2017-05-11: qty 50

## 2017-05-11 MED ORDER — FENTANYL CITRATE (PF) 100 MCG/2ML IJ SOLN
25.0000 ug | Freq: Once | INTRAMUSCULAR | Status: AC
  Administered 2017-05-11: 25 ug via INTRAVENOUS
  Filled 2017-05-11: qty 2

## 2017-05-11 MED ORDER — GLYCOPYRROLATE 0.2 MG/ML IJ SOLN
INTRAMUSCULAR | Status: AC
  Filled 2017-05-11: qty 1

## 2017-05-11 MED ORDER — MIDAZOLAM HCL 2 MG/2ML IJ SOLN
INTRAMUSCULAR | Status: AC
  Filled 2017-05-11: qty 2

## 2017-05-11 MED ORDER — HYDROCODONE-ACETAMINOPHEN 5-325 MG PO TABS: 1.0000 | ORAL_TABLET | Freq: Four times a day (QID) | ORAL | 0 refills | Status: DC | PRN

## 2017-05-11 MED ORDER — EPHEDRINE SULFATE 50 MG/ML IJ SOLN
INTRAMUSCULAR | Status: DC | PRN
  Administered 2017-05-11: 10 mg via INTRAVENOUS

## 2017-05-11 MED ORDER — PROPOFOL 10 MG/ML IV BOLUS
INTRAVENOUS | Status: DC | PRN
  Administered 2017-05-11: 10 mg via INTRAVENOUS

## 2017-05-11 MED ORDER — PROPOFOL 10 MG/ML IV BOLUS
INTRAVENOUS | Status: AC
  Filled 2017-05-11: qty 20

## 2017-05-11 MED ORDER — POVIDONE-IODINE 10 % EX OINT
TOPICAL_OINTMENT | CUTANEOUS | Status: AC
  Filled 2017-05-11: qty 1

## 2017-05-11 MED ORDER — LIDOCAINE HCL (PF) 1 % IJ SOLN
INTRAMUSCULAR | Status: AC
  Filled 2017-05-11: qty 30

## 2017-05-11 MED ORDER — CEFAZOLIN SODIUM-DEXTROSE 2-4 GM/100ML-% IV SOLN
2.0000 g | INTRAVENOUS | Status: AC
  Administered 2017-05-11: 2 g via INTRAVENOUS
  Filled 2017-05-11: qty 100

## 2017-05-11 SURGICAL SUPPLY — 29 items
BAG HAMPER (MISCELLANEOUS) ×2 IMPLANT
BLADE SURG SZ11 CARB STEEL (BLADE) ×2 IMPLANT
CHLORAPREP W/TINT 10.5 ML (MISCELLANEOUS) ×2 IMPLANT
CLOTH BEACON ORANGE TIMEOUT ST (SAFETY) ×2 IMPLANT
COVER LIGHT HANDLE STERIS (MISCELLANEOUS) ×4 IMPLANT
DECANTER SPIKE VIAL GLASS SM (MISCELLANEOUS) ×2 IMPLANT
ELECT NEEDLE TIP 2.8 STRL (NEEDLE) ×2 IMPLANT
ELECT REM PT RETURN 9FT ADLT (ELECTROSURGICAL) ×2
ELECTRODE REM PT RTRN 9FT ADLT (ELECTROSURGICAL) ×1 IMPLANT
FORMALIN 10 PREFIL 120ML (MISCELLANEOUS) ×2 IMPLANT
GAUZE SPONGE 4X4 12PLY STRL (GAUZE/BANDAGES/DRESSINGS) ×4 IMPLANT
GLOVE BIO SURGEON STRL SZ 6.5 (GLOVE) ×2 IMPLANT
GLOVE BIOGEL PI IND STRL 7.0 (GLOVE) ×2 IMPLANT
GLOVE BIOGEL PI INDICATOR 7.0 (GLOVE) ×2
GLOVE SURG SS PI 7.5 STRL IVOR (GLOVE) ×2 IMPLANT
GOWN STRL REUS W/ TWL XL LVL3 (GOWN DISPOSABLE) ×1 IMPLANT
GOWN STRL REUS W/TWL LRG LVL3 (GOWN DISPOSABLE) ×2 IMPLANT
GOWN STRL REUS W/TWL XL LVL3 (GOWN DISPOSABLE) ×1
KIT ROOM TURNOVER APOR (KITS) ×2 IMPLANT
MANIFOLD NEPTUNE II (INSTRUMENTS) ×2 IMPLANT
NEEDLE HYPO 25X1 1.5 SAFETY (NEEDLE) ×2 IMPLANT
NS IRRIG 1000ML POUR BTL (IV SOLUTION) ×2 IMPLANT
PACK MINOR (CUSTOM PROCEDURE TRAY) ×2 IMPLANT
PAD ARMBOARD 7.5X6 YLW CONV (MISCELLANEOUS) ×2 IMPLANT
SET BASIN LINEN APH (SET/KITS/TRAYS/PACK) ×2 IMPLANT
SPONGE GAUZE 2X2 8PLY STRL LF (GAUZE/BANDAGES/DRESSINGS) ×2 IMPLANT
SUT ETHILON 3 0 FSL (SUTURE) ×4 IMPLANT
SYR CONTROL 10ML LL (SYRINGE) ×2 IMPLANT
TAPE CLOTH SURG 4X10 WHT LF (GAUZE/BANDAGES/DRESSINGS) ×2 IMPLANT

## 2017-05-11 NOTE — Op Note (Signed)
Patient:  Peter Martinez  DOB:  [birth year]  MRN:  [id number]   Preop Diagnosis:  Enlarging sebaceous cyst, scalp  Postop Diagnosis:  Same  Procedure:  Excision of sebaceous cyst, scalp  Surgeon:  Aviva Signs, M.D.  Anes:  Mac  Indications:  Patient is a 65 year old white male who presents with an enlarging sebaceous cyst on his scalp. It is becoming tender to touch. The risks and benefits of the procedure including bleeding, infection, and recurrence of the cyst were fully explained to the patient, who gave informed consent.  Procedure note:  The patient was placed in the left lateral decubitus position. The scalp was prepped and draped using usual sterile technique with DuraPrep. Surgical site confirmation was performed. One percent Xylocaine was used for local anesthesia.  An incision was made over the sebaceous cyst which was very prominent at the top of his scalp. The sebaceous cyst was removed without difficulty. It was sent to pathology for further examination. The excess skin overlying it was excised in order to provide adequate closure. The incision line measured approximately 4 cm. A bleeding was controlled using Bovie electrocautery. The wound was irrigated with normal saline. The skin was closed using 3-0 nylon interrupted sutures. Betadine ointment and dry sterile dressing were applied.  All tape and needle counts were correct at the end the procedure. The patient was awakened and transferred to PACU in stable condition.  Complications:  None  EBL:  Minimal  Specimen:  Sebaceous cyst, scalp

## 2017-05-11 NOTE — Interval H&P Note (Signed)
History and Physical Interval Note:  05/11/2017 8:57 AM  Peter Martinez  has presented today for surgery, with the diagnosis of infected sebaceous cyst  The various methods of treatment have been discussed with the patient and family. After consideration of risks, benefits and other options for treatment, the patient has consented to  Procedure(s): EXCISION OF CYST ON SCALP (N/A) as a surgical intervention .  The patient's history has been reviewed, patient examined, no change in status, stable for surgery.  I have reviewed the patient's chart and labs.  Questions were answered to the patient's satisfaction.     Aviva Signs

## 2017-05-11 NOTE — Discharge Instructions (Signed)
Excision of Skin Lesions, Care After Refer to this sheet in the next few weeks. These instructions provide you with information about caring for yourself after your procedure. Your health care provider may also give you more specific instructions. Your treatment has been planned according to current medical practices, but problems sometimes occur. Call your health care provider if you have any problems or questions after your procedure. What can I expect after the procedure? After your procedure, it is common to have pain or discomfort at the excision site. Follow these instructions at home:  Take over-the-counter and prescription medicines only as told by your health care provider.  Follow instructions from your health care provider about: ? How to take care of your excision site. You should keep the site clean, dry, and protected for at least 48 hours. ? When and how you should change your bandage (dressing). ? When you should remove your dressing. ? Removing whatever was used to close your excision site.  Check the excision area every day for signs of infection. Watch for: ? Redness, swelling, or pain. ? Fluid, blood, or pus.  For bleeding, apply gentle but firm pressure to the area using a folded towel for 20 minutes.  Avoid high-impact exercise and activities until the stitches (sutures) are removed or the area heals.  Follow instructions from your health care provider about how to minimize scarring. Avoid sun exposure until the area has healed. Scarring should lessen over time.  Keep all follow-up visits as told by your health care provider. This is important. Contact a health care provider if:  You have a fever.  You have redness, swelling, or pain at the excision site.  You have fluid, blood, or pus coming from the excision site.  You have ongoing bleeding at the excision site.  You have pain that does not improve in 2-3 days after your procedure.  You notice skin  irregularities or changes in sensation. This information is not intended to replace advice given to you by your health care provider. Make sure you discuss any questions you have with your health care provider. Document Released: 12/15/2014 Document Revised: 01/06/2016 Document Reviewed: 09/16/2014 Elsevier Interactive Patient Education  2018 [employer]  

## 2017-05-11 NOTE — Anesthesia Postprocedure Evaluation (Signed)
Anesthesia Post Note  Patient: Peter Martinez  Procedure(s) Performed: Procedure(s) (LRB): EXCISION OF CYST ON SCALP (N/A)  Patient location during evaluation: PACU Anesthesia Type: MAC Level of consciousness: awake and alert and oriented Pain management: pain level controlled Vital Signs Assessment: post-procedure vital signs reviewed and stable Respiratory status: spontaneous breathing Cardiovascular status: blood pressure returned to baseline and stable Postop Assessment: no apparent nausea or vomiting Anesthetic complications: no Comments: Late entry     Last Vitals:  Vitals:   05/11/17 1030 05/11/17 1058  BP:  (!) 142/73  Pulse: (!) 41 (!) 44  Resp: 11   Temp:    SpO2: 100% 98%    Last Pain:  Vitals:   05/11/17 0822  TempSrc: Oral                 Loriann Bosserman

## 2017-05-11 NOTE — Transfer of Care (Signed)
Immediate Anesthesia Transfer of Care Note  Patient: Peter Martinez  Procedure(s) Performed: Procedure(s): EXCISION OF CYST ON SCALP (N/A)  Patient Location: PACU  Anesthesia Type:General  Level of Consciousness: awake, alert  and oriented  Airway & Oxygen Therapy: Patient Spontanous Breathing and Patient connected to nasal cannula oxygen  Post-op Assessment: Report given to RN  Post vital signs: Reviewed and stable  Last Vitals:  Vitals:   05/11/17 0915 05/11/17 0920  BP: 120/75 116/71  Pulse:    Resp: 12 20  Temp:    SpO2: 100% 100%    Last Pain:  Vitals:   05/11/17 0822  TempSrc: Oral      Patients Stated Pain Goal: 5 (19/01/22 2411)  Complications: No apparent anesthesia complications

## 2017-05-11 NOTE — Anesthesia Preprocedure Evaluation (Signed)
Anesthesia Evaluation  Patient identified by MRN, date of birth, ID band Patient awake    Reviewed: Allergy & Precautions, NPO status , Patient's Chart, lab work & pertinent test results  Airway Mallampati: II  TM Distance: >3 FB     Dental  (+) Teeth Intact   Pulmonary neg pulmonary ROS,    breath sounds clear to auscultation       Cardiovascular hypertension, Pt. on medications  Rhythm:Regular Rate:Normal     Neuro/Psych negative neurological ROS  negative psych ROS   GI/Hepatic negative GI ROS, Neg liver ROS,   Endo/Other  diabetes, Type 2, Oral Hypoglycemic Agents  Renal/GU Renal disease     Musculoskeletal  (+) Arthritis  (Gout),   Abdominal   Peds  Hematology   Anesthesia Other Findings   Reproductive/Obstetrics                             Anesthesia Physical Anesthesia Plan  ASA: II  Anesthesia Plan: MAC   Post-op Pain Management:    Induction: Intravenous  PONV Risk Score and Plan:   Airway Management Planned: Simple Face Mask  Additional Equipment:   Intra-op Plan:   Post-operative Plan:   Informed Consent: I have reviewed the patients History and Physical, chart, labs and discussed the procedure including the risks, benefits and alternatives for the proposed anesthesia with the patient or authorized representative who has indicated his/her understanding and acceptance.     Plan Discussed with: CRNA and Surgeon  Anesthesia Plan Comments: (possible GOT if needed .Marland Kitchen..discussed with pt and he agrees. )        Anesthesia Quick Evaluation

## 2017-05-22 ENCOUNTER — Ambulatory Visit (INDEPENDENT_AMBULATORY_CARE_PROVIDER_SITE_OTHER): Payer: Self-pay | Admitting: General Surgery

## 2017-05-22 ENCOUNTER — Encounter: Payer: Self-pay | Admitting: General Surgery

## 2017-05-22 VITALS — BP 158/85 | HR 83 | Temp 98.0°F | Resp 18 | Ht 76.0 in | Wt 222.0 lb

## 2017-05-22 DIAGNOSIS — Z09 Encounter for follow-up examination after completed treatment for conditions other than malignant neoplasm: Secondary | ICD-10-CM

## 2017-05-22 NOTE — Progress Notes (Signed)
Subjective:     Peter Martinez  Status post excision of sebaceous cyst, scalp. Doing well. No complaints. Objective:    BP (!) 158/85   Pulse 83   Temp 98 F (36.7 C)   Resp 18   Ht 6\' 4"  (1.93 m)   Wt 222 lb (100.7 kg)   BMI 27.02 kg/m   General:  alert, cooperative and no distress  Scalp incision healing well. Sutures removed. Final path consistent with diagnosis.     Assessment:    Doing well postoperatively.    Plan:   Follow-up as needed.

## 2017-08-28 DIAGNOSIS — C61 Malignant neoplasm of prostate: Secondary | ICD-10-CM | POA: Diagnosis not present

## 2017-09-03 DIAGNOSIS — C61 Malignant neoplasm of prostate: Secondary | ICD-10-CM | POA: Diagnosis not present

## 2017-09-03 DIAGNOSIS — R3915 Urgency of urination: Secondary | ICD-10-CM | POA: Diagnosis not present

## 2017-09-03 DIAGNOSIS — N5201 Erectile dysfunction due to arterial insufficiency: Secondary | ICD-10-CM | POA: Diagnosis not present

## 2017-11-26 DIAGNOSIS — E119 Type 2 diabetes mellitus without complications: Secondary | ICD-10-CM | POA: Diagnosis not present

## 2017-12-03 DIAGNOSIS — Z23 Encounter for immunization: Secondary | ICD-10-CM | POA: Diagnosis not present

## 2017-12-03 DIAGNOSIS — E1149 Type 2 diabetes mellitus with other diabetic neurological complication: Secondary | ICD-10-CM | POA: Diagnosis not present

## 2017-12-03 DIAGNOSIS — L89899 Pressure ulcer of other site, unspecified stage: Secondary | ICD-10-CM | POA: Diagnosis not present

## 2017-12-03 DIAGNOSIS — Z6827 Body mass index (BMI) 27.0-27.9, adult: Secondary | ICD-10-CM | POA: Diagnosis not present

## 2018-02-25 DIAGNOSIS — C61 Malignant neoplasm of prostate: Secondary | ICD-10-CM | POA: Diagnosis not present

## 2018-03-04 DIAGNOSIS — N5201 Erectile dysfunction due to arterial insufficiency: Secondary | ICD-10-CM | POA: Diagnosis not present

## 2018-03-04 DIAGNOSIS — R3915 Urgency of urination: Secondary | ICD-10-CM | POA: Diagnosis not present

## 2018-03-04 DIAGNOSIS — C61 Malignant neoplasm of prostate: Secondary | ICD-10-CM | POA: Diagnosis not present

## 2018-04-08 DIAGNOSIS — E1149 Type 2 diabetes mellitus with other diabetic neurological complication: Secondary | ICD-10-CM | POA: Diagnosis not present

## 2018-04-08 DIAGNOSIS — E785 Hyperlipidemia, unspecified: Secondary | ICD-10-CM | POA: Diagnosis not present

## 2018-07-22 DIAGNOSIS — E119 Type 2 diabetes mellitus without complications: Secondary | ICD-10-CM | POA: Diagnosis not present

## 2018-07-29 DIAGNOSIS — E1159 Type 2 diabetes mellitus with other circulatory complications: Secondary | ICD-10-CM | POA: Diagnosis not present

## 2018-07-29 DIAGNOSIS — I10 Essential (primary) hypertension: Secondary | ICD-10-CM | POA: Diagnosis not present

## 2018-09-12 ENCOUNTER — Inpatient Hospital Stay (HOSPITAL_COMMUNITY)
Admission: EM | Admit: 2018-09-12 | Discharge: 2018-09-13 | DRG: 065 | Disposition: A | Discharge status: Home / Self Care | Payer: BLUE CROSS/BLUE SHIELD | Attending: Family Medicine | Admitting: Family Medicine

## 2018-09-12 ENCOUNTER — Observation Stay (HOSPITAL_COMMUNITY): Payer: BLUE CROSS/BLUE SHIELD

## 2018-09-12 ENCOUNTER — Encounter (HOSPITAL_COMMUNITY): Payer: Self-pay | Admitting: Internal Medicine

## 2018-09-12 ENCOUNTER — Emergency Department (HOSPITAL_COMMUNITY): Payer: BLUE CROSS/BLUE SHIELD

## 2018-09-12 ENCOUNTER — Other Ambulatory Visit (HOSPITAL_COMMUNITY): Payer: BLUE CROSS/BLUE SHIELD

## 2018-09-12 ENCOUNTER — Other Ambulatory Visit: Payer: Self-pay

## 2018-09-12 DIAGNOSIS — I10 Essential (primary) hypertension: Secondary | ICD-10-CM | POA: Diagnosis present

## 2018-09-12 DIAGNOSIS — R29704 NIHSS score 4: Secondary | ICD-10-CM | POA: Diagnosis not present

## 2018-09-12 DIAGNOSIS — R0902 Hypoxemia: Secondary | ICD-10-CM | POA: Diagnosis not present

## 2018-09-12 DIAGNOSIS — I63111 Cerebral infarction due to embolism of right vertebral artery: Secondary | ICD-10-CM

## 2018-09-12 DIAGNOSIS — I672 Cerebral atherosclerosis: Secondary | ICD-10-CM | POA: Diagnosis not present

## 2018-09-12 DIAGNOSIS — Z923 Personal history of irradiation: Secondary | ICD-10-CM

## 2018-09-12 DIAGNOSIS — Z7982 Long term (current) use of aspirin: Secondary | ICD-10-CM | POA: Diagnosis not present

## 2018-09-12 DIAGNOSIS — M109 Gout, unspecified: Secondary | ICD-10-CM | POA: Diagnosis present

## 2018-09-12 DIAGNOSIS — R278 Other lack of coordination: Secondary | ICD-10-CM | POA: Diagnosis present

## 2018-09-12 DIAGNOSIS — R569 Unspecified convulsions: Secondary | ICD-10-CM | POA: Diagnosis not present

## 2018-09-12 DIAGNOSIS — Z87442 Personal history of urinary calculi: Secondary | ICD-10-CM | POA: Diagnosis not present

## 2018-09-12 DIAGNOSIS — Z806 Family history of leukemia: Secondary | ICD-10-CM | POA: Diagnosis not present

## 2018-09-12 DIAGNOSIS — Z823 Family history of stroke: Secondary | ICD-10-CM

## 2018-09-12 DIAGNOSIS — I639 Cerebral infarction, unspecified: Secondary | ICD-10-CM | POA: Diagnosis present

## 2018-09-12 DIAGNOSIS — Z66 Do not resuscitate: Secondary | ICD-10-CM | POA: Diagnosis not present

## 2018-09-12 DIAGNOSIS — R29710 NIHSS score 10: Secondary | ICD-10-CM | POA: Diagnosis not present

## 2018-09-12 DIAGNOSIS — Z803 Family history of malignant neoplasm of breast: Secondary | ICD-10-CM | POA: Diagnosis not present

## 2018-09-12 DIAGNOSIS — G40901 Epilepsy, unspecified, not intractable, with status epilepticus: Secondary | ICD-10-CM | POA: Diagnosis not present

## 2018-09-12 DIAGNOSIS — Z79899 Other long term (current) drug therapy: Secondary | ICD-10-CM

## 2018-09-12 DIAGNOSIS — R4701 Aphasia: Secondary | ICD-10-CM | POA: Diagnosis not present

## 2018-09-12 DIAGNOSIS — L4050 Arthropathic psoriasis, unspecified: Secondary | ICD-10-CM | POA: Diagnosis present

## 2018-09-12 DIAGNOSIS — Z79891 Long term (current) use of opiate analgesic: Secondary | ICD-10-CM

## 2018-09-12 DIAGNOSIS — G40409 Other generalized epilepsy and epileptic syndromes, not intractable, without status epilepticus: Secondary | ICD-10-CM | POA: Diagnosis present

## 2018-09-12 DIAGNOSIS — H5340 Unspecified visual field defects: Secondary | ICD-10-CM | POA: Diagnosis present

## 2018-09-12 DIAGNOSIS — G8191 Hemiplegia, unspecified affecting right dominant side: Secondary | ICD-10-CM | POA: Diagnosis not present

## 2018-09-12 DIAGNOSIS — E785 Hyperlipidemia, unspecified: Secondary | ICD-10-CM | POA: Diagnosis present

## 2018-09-12 DIAGNOSIS — C61 Malignant neoplasm of prostate: Secondary | ICD-10-CM | POA: Diagnosis not present

## 2018-09-12 DIAGNOSIS — R404 Transient alteration of awareness: Secondary | ICD-10-CM | POA: Diagnosis not present

## 2018-09-12 DIAGNOSIS — E119 Type 2 diabetes mellitus without complications: Secondary | ICD-10-CM | POA: Diagnosis not present

## 2018-09-12 DIAGNOSIS — Z9089 Acquired absence of other organs: Secondary | ICD-10-CM

## 2018-09-12 DIAGNOSIS — Z8546 Personal history of malignant neoplasm of prostate: Secondary | ICD-10-CM | POA: Diagnosis not present

## 2018-09-12 DIAGNOSIS — Z7984 Long term (current) use of oral hypoglycemic drugs: Secondary | ICD-10-CM

## 2018-09-12 DIAGNOSIS — Z8249 Family history of ischemic heart disease and other diseases of the circulatory system: Secondary | ICD-10-CM

## 2018-09-12 DIAGNOSIS — I63412 Cerebral infarction due to embolism of left middle cerebral artery: Secondary | ICD-10-CM | POA: Diagnosis not present

## 2018-09-12 DIAGNOSIS — R2990 Unspecified symptoms and signs involving the nervous system: Secondary | ICD-10-CM

## 2018-09-12 DIAGNOSIS — R29818 Other symptoms and signs involving the nervous system: Secondary | ICD-10-CM | POA: Diagnosis not present

## 2018-09-12 DIAGNOSIS — R4182 Altered mental status, unspecified: Secondary | ICD-10-CM | POA: Diagnosis not present

## 2018-09-12 HISTORY — DX: Psoriasis, unspecified: L40.9

## 2018-09-12 LAB — DIFFERENTIAL
Abs Immature Granulocytes: 0.02 10*3/uL (ref 0.00–0.07)
BASOS ABS: 0 10*3/uL (ref 0.0–0.1)
Basophils Relative: 0 %
Eosinophils Absolute: 0.1 10*3/uL (ref 0.0–0.5)
Eosinophils Relative: 1 %
IMMATURE GRANULOCYTES: 0 %
Lymphocytes Relative: 14 %
Lymphs Abs: 0.8 10*3/uL (ref 0.7–4.0)
Monocytes Absolute: 0.7 10*3/uL (ref 0.1–1.0)
Monocytes Relative: 13 %
Neutro Abs: 3.8 10*3/uL (ref 1.7–7.7)
Neutrophils Relative %: 72 %

## 2018-09-12 LAB — URINALYSIS, ROUTINE W REFLEX MICROSCOPIC
Bilirubin Urine: NEGATIVE
GLUCOSE, UA: NEGATIVE mg/dL
HGB URINE DIPSTICK: NEGATIVE
Ketones, ur: NEGATIVE mg/dL
Leukocytes, UA: NEGATIVE
Nitrite: NEGATIVE
Protein, ur: NEGATIVE mg/dL
Specific Gravity, Urine: 1.03 (ref 1.005–1.030)
pH: 7 (ref 5.0–8.0)

## 2018-09-12 LAB — RAPID URINE DRUG SCREEN, HOSP PERFORMED
Amphetamines: NOT DETECTED
BENZODIAZEPINES: POSITIVE — AB
Barbiturates: NOT DETECTED
Cocaine: NOT DETECTED
OPIATES: NOT DETECTED
Tetrahydrocannabinol: NOT DETECTED

## 2018-09-12 LAB — COMPREHENSIVE METABOLIC PANEL
ALBUMIN: 3.7 g/dL (ref 3.5–5.0)
ALT: 17 U/L (ref 0–44)
AST: 24 U/L (ref 15–41)
Alkaline Phosphatase: 75 U/L (ref 38–126)
Anion gap: 8 (ref 5–15)
BUN: 14 mg/dL (ref 8–23)
CHLORIDE: 106 mmol/L (ref 98–111)
CO2: 24 mmol/L (ref 22–32)
Calcium: 9 mg/dL (ref 8.9–10.3)
Creatinine, Ser: 1.18 mg/dL (ref 0.61–1.24)
GFR calc Af Amer: >60 mL/min (ref >60)
GFR calc non Af Amer: >60 mL/min (ref >60)
Glucose, Bld: 126 mg/dL — ABNORMAL HIGH (ref 70–99)
Potassium: 4.1 mmol/L (ref 3.5–5.1)
Sodium: 138 mmol/L (ref 135–145)
Total Bilirubin: 0.9 mg/dL (ref 0.3–1.2)
Total Protein: 7.3 g/dL (ref 6.5–8.1)

## 2018-09-12 LAB — CBG MONITORING, ED: Glucose-Capillary: 122 mg/dL — ABNORMAL HIGH (ref 70–99)

## 2018-09-12 LAB — I-STAT CREATININE, ED: Creatinine, Ser: 1.2 mg/dL (ref 0.61–1.24)

## 2018-09-12 LAB — CBC
HCT: 44.2 % (ref 39.0–52.0)
Hemoglobin: 13.9 g/dL (ref 13.0–17.0)
MCH: 30.5 pg (ref 26.0–34.0)
MCHC: 31.4 g/dL (ref 30.0–36.0)
MCV: 96.9 fL (ref 80.0–100.0)
NRBC: 0 % (ref 0.0–0.2)
Platelets: 197 10*3/uL (ref 150–400)
RBC: 4.56 MIL/uL (ref 4.22–5.81)
RDW: 14.9 % (ref 11.5–15.5)
WBC: 5.4 10*3/uL (ref 4.0–10.5)

## 2018-09-12 LAB — GLUCOSE, CAPILLARY: Glucose-Capillary: 94 mg/dL (ref 70–99)

## 2018-09-12 LAB — PROTIME-INR
INR: 1.08
Prothrombin Time: 13.9 seconds (ref 11.4–15.2)

## 2018-09-12 LAB — HEMOGLOBIN A1C
HEMOGLOBIN A1C: 6.1 % — AB (ref 4.8–5.6)
Mean Plasma Glucose: 128.37 mg/dL

## 2018-09-12 LAB — I-STAT TROPONIN, ED: Troponin i, poc: 0 ng/mL (ref 0.00–0.08)

## 2018-09-12 LAB — ETHANOL: Alcohol, Ethyl (B): <10 mg/dL (ref <10)

## 2018-09-12 LAB — TSH: TSH: 2.408 u[IU]/mL (ref 0.350–4.500)

## 2018-09-12 LAB — APTT: aPTT: 29 seconds (ref 24–36)

## 2018-09-12 MED ORDER — HYDROXYCHLOROQUINE SULFATE 200 MG PO TABS
200.0000 mg | ORAL_TABLET | Freq: Two times a day (BID) | ORAL | Status: DC
  Administered 2018-09-12 – 2018-09-13 (×2): 200 mg via ORAL
  Filled 2018-09-12 (×2): qty 1

## 2018-09-12 MED ORDER — ASPIRIN EC 81 MG PO TBEC
81.0000 mg | DELAYED_RELEASE_TABLET | Freq: Every day | ORAL | Status: DC
  Administered 2018-09-13: 81 mg via ORAL
  Filled 2018-09-12: qty 1

## 2018-09-12 MED ORDER — LEVETIRACETAM IN NACL 1500 MG/100ML IV SOLN
1500.0000 mg | INTRAVENOUS | Status: AC
  Administered 2018-09-12: 1500 mg via INTRAVENOUS
  Filled 2018-09-12: qty 100

## 2018-09-12 MED ORDER — FOLIC ACID 1 MG PO TABS
1.0000 mg | ORAL_TABLET | Freq: Every day | ORAL | Status: DC
  Administered 2018-09-13: 1 mg via ORAL
  Filled 2018-09-12: qty 1

## 2018-09-12 MED ORDER — INSULIN ASPART 100 UNIT/ML ~~LOC~~ SOLN
0.0000 [IU] | Freq: Three times a day (TID) | SUBCUTANEOUS | Status: DC
  Administered 2018-09-13: 2 [IU] via SUBCUTANEOUS

## 2018-09-12 MED ORDER — SODIUM CHLORIDE 0.9 % IV SOLN: INTRAVENOUS | Status: DC

## 2018-09-12 MED ORDER — INSULIN ASPART 100 UNIT/ML ~~LOC~~ SOLN: 0.0000 [IU] | Freq: Every day | SUBCUTANEOUS | Status: DC

## 2018-09-12 MED ORDER — ALLOPURINOL 100 MG PO TABS
300.0000 mg | ORAL_TABLET | Freq: Every day | ORAL | Status: DC
  Administered 2018-09-13: 300 mg via ORAL
  Filled 2018-09-12: qty 3

## 2018-09-12 MED ORDER — ACETAMINOPHEN 650 MG RE SUPP: 650.0000 mg | RECTAL | Status: DC | PRN

## 2018-09-12 MED ORDER — IOPAMIDOL (ISOVUE-370) INJECTION 76%
100.0000 mL | Freq: Once | INTRAVENOUS | Status: AC | PRN
  Administered 2018-09-12: 100 mL via INTRAVENOUS

## 2018-09-12 MED ORDER — ACETAMINOPHEN 160 MG/5ML PO SOLN: 650.0000 mg | ORAL | Status: DC | PRN

## 2018-09-12 MED ORDER — ENOXAPARIN SODIUM 40 MG/0.4ML ~~LOC~~ SOLN
40.0000 mg | SUBCUTANEOUS | Status: DC
  Administered 2018-09-12 – 2018-09-13 (×2): 40 mg via SUBCUTANEOUS
  Filled 2018-09-12 (×2): qty 0.4

## 2018-09-12 MED ORDER — ACETAMINOPHEN 325 MG PO TABS: 650.0000 mg | ORAL_TABLET | ORAL | Status: DC | PRN

## 2018-09-12 MED ORDER — LEVETIRACETAM 500 MG PO TABS
500.0000 mg | ORAL_TABLET | Freq: Two times a day (BID) | ORAL | Status: DC
  Administered 2018-09-12 – 2018-09-13 (×2): 500 mg via ORAL
  Filled 2018-09-12 (×2): qty 1

## 2018-09-12 MED ORDER — SENNOSIDES-DOCUSATE SODIUM 8.6-50 MG PO TABS: 1.0000 | ORAL_TABLET | Freq: Every evening | ORAL | Status: DC | PRN

## 2018-09-12 MED ORDER — ATORVASTATIN CALCIUM 80 MG PO TABS
80.0000 mg | ORAL_TABLET | Freq: Every day | ORAL | Status: DC
  Administered 2018-09-12: 80 mg via ORAL
  Filled 2018-09-12: qty 1

## 2018-09-12 MED ORDER — GADOBUTROL 1 MMOL/ML IV SOLN
10.0000 mL | Freq: Once | INTRAVENOUS | Status: AC | PRN
  Administered 2018-09-12: 10 mL via INTRAVENOUS

## 2018-09-12 MED ORDER — STROKE: EARLY STAGES OF RECOVERY BOOK
Freq: Once | Status: DC
  Filled 2018-09-12: qty 1

## 2018-09-12 MED ORDER — ENSURE ENLIVE PO LIQD
237.0000 mL | Freq: Two times a day (BID) | ORAL | Status: DC
  Administered 2018-09-12 – 2018-09-13 (×2): 237 mL via ORAL

## 2018-09-12 NOTE — H&P (Signed)
History and Physical    Peter Martinez KDX:[id number] DOB: 14-Jul-1951 DOA: 09/12/2018  PCP: Asencion Noble, MD Consultants:  Tammi Klippel - rad onc; [facility] - urology; [place] - rheumatology, [place] Patient coming from:  Home - lives with wife; Donald Prose: Wife, [phone], [phone]; daughter, [phone]  Chief Complaint: code stroke  HPI: Peter Martinez is a 68 y.o. male with medical history significant of HTN; DM; and prostate CA presenting as Code Stroke.  He reports confusion.  He noticed it this AM.  He as having difficulty figuring out "some things" while trying to get ready for work.  He was stopping and staring at things, refused to answer questions and was muttering.  He was perfectly fine last night other than being tired.  He slept poorly but this is chronic.  He "had a little problem yesterday" - his right arm went numb during the day while he was driving.  It lasted 15-20 minutes and it resolved but continued to happen intermittently through the day.  It happened 2+ times and "maybe a little bit the day before."  No difficulty speaking or swallowing.  It was his entire right arm.  He did not have leg symptoms.  He also had weakness.  He didn't drop anything "but it was pretty weak for a while."  No tongue biting or incontinence.  Monday night, he was driving in circles in [place] and it was hard for him to see the signs and understand the GPS directions.   ED Course:  Stroke-like symptoms, new seizure activity.  H/o prostate CA, not currently receiving treatment.  Non-focal on presentation with ?right neglect.  Negative head CT.  Back to baseline.  For EEG and MRI.  Loaded with Keppra.  Review of Systems: As per HPI; otherwise review of systems reviewed and negative.   Ambulatory Status:  Ambulates without assistance  Past Medical History:  Diagnosis Date  . Anemia   . Arthritis    psoriatic  . Cancer [facility])    prostate  . Diabetes mellitus   . Gout   . H/O mixed connective  tissue disease   . History of kidney stones   . Hypertension   . Kidney stones   . Psoriasis   . Wears glasses   . Wears partial dentures    upper    Past Surgical History:  Procedure Laterality Date  . MASS EXCISION N/A 05/11/2017   Procedure: EXCISION OF CYST ON SCALP;  Surgeon: Aviva Signs, MD;  Location: [facility];  Service: General;  Laterality: N/A;  . RADIOACTIVE SEED IMPLANT N/A 10/27/2016   Procedure: RADIOACTIVE SEED IMPLANT/BRACHYTHERAPY IMPLANT;  Surgeon: Alexis Frock, MD;  Location: [facility];  Service: Urology;  Laterality: N/A;  . TONSILLECTOMY      Social History   Socioeconomic History  . Marital status: Married    Spouse name: Not on file  . Number of children: Not on file  . Years of education: Not on file  . Highest education level: Not on file  Occupational History  . Occupation: courier  . Occupation: [employer]  Social Needs  . Financial resource strain: Not on file  . Food insecurity:    Worry: Not on file    Inability: Not on file  . Transportation needs:    Medical: Not on file    Non-medical: Not on file  Tobacco Use  . Smoking status: Never Smoker  . Smokeless tobacco: Never Used  Substance and Sexual Activity  .  Alcohol use: No  . Drug use: No  . Sexual activity: Not on file  Lifestyle  . Physical activity:    Days per week: Not on file    Minutes per session: Not on file  . Stress: Not on file  Relationships  . Social connections:    Talks on phone: Not on file    Gets together: Not on file    Attends religious service: Not on file    Active member of club or organization: Not on file    Attends meetings of clubs or organizations: Not on file    Relationship status: Not on file  . Intimate partner violence:    Fear of current or ex partner: Not on file    Emotionally abused: Not on file    Physically abused: Not on file    Forced sexual activity: Not on file  Other Topics  Concern  . Not on file  Social History Narrative  . Not on file    No Known Allergies  Family History  Problem Relation Age of Onset  . Breast cancer Mother   . Dementia Mother 60  . Stroke Father 13  . Leukemia Father   . Stroke Brother   . CAD Brother 69    Prior to Admission medications   Medication Sig Start Date End Date Taking? Authorizing Provider  acetaminophen (TYLENOL) 500 MG tablet Take 1,000 mg by mouth as needed.    [provider]  allopurinol (ZYLOPRIM) 300 MG tablet Take 300 mg by mouth daily.    [provider]  aspirin EC 81 MG tablet Take 81 mg by mouth daily.    [provider]  cholecalciferol (VITAMIN D) 1000 units tablet Take 5,000 Units by mouth at bedtime.    [provider]  colchicine 0.6 MG tablet One tab po q 2 hrs until pain relief or stomach upset Patient taking differently: Take 0.6 mg by mouth 2 (two) times daily as needed. One tab po q 2 hrs until pain relief or stomach upset 02/29/12   Triplett, Tammy, PA-C  Cyanocobalamin (B-12) 2500 MCG TABS Take 2,500 mcg by mouth daily.    [provider]  folic acid (FOLVITE) 1 MG tablet Take 1 mg by mouth daily.    [provider]  HYDROcodone-acetaminophen (NORCO) 5-325 MG tablet Take 1 tablet by mouth every 6 (six) hours as needed for moderate pain. 05/11/17   Aviva Signs, MD  hydroxychloroquine (PLAQUENIL) 200 MG tablet Take 200 mg by mouth 2 (two) times daily.    [provider]  losartan (COZAAR) 100 MG tablet Take 100 mg by mouth at bedtime.     [provider]  metFORMIN (GLUCOPHAGE) 1000 MG tablet Take 1,000 mg by mouth 2 (two) times daily with a meal.    [provider]  methotrexate (RHEUMATREX) 2.5 MG tablet Take 20 mg by mouth once a week. On Fridays  8 tablets    [provider]  metoprolol tartrate (LOPRESSOR) 50 MG tablet Take 50 mg by mouth 2 (two) times daily.     [provider]  simvastatin  (ZOCOR) 10 MG tablet Take 10 mg by mouth at bedtime.     [provider]    Physical Exam: Vitals:   09/12/18 0941 09/12/18 1101 09/12/18 1305 09/12/18 1552  BP:  (!) 150/92 137/85 139/83  Pulse:  62 (!) 57 (!) 56  Resp:  18 20 20   Temp: 98.1 F (36.7 C)  98.3  F (36.8 C) 97.8 F (36.6 C)  TempSrc: Oral  Oral Oral  SpO2:  99% 100% 100%  Weight:   101.7 kg   Height:   6\' 4"  (1.93 m)      . General:  Appears calm and comfortable and is NAD; somewhat frustrated . Eyes:  PERRL, EOMI, normal lids, iris . ENT:  grossly normal hearing, lips & tongue, mmm . Neck:  no LAD, masses or thyromegaly; no carotid bruits . Cardiovascular:  RRR, no m/r/g. No LE edema.  Marland Kitchen Respiratory:   CTA bilaterally with no wheezes/rales/rhonchi.  Normal respiratory effort. . Abdomen:  soft, NT, ND, NABS . Back:   normal alignment, no CVAT . Skin:  no rash or induration seen on limited exam . Musculoskeletal:  grossly normal tone BUE/BLE, good ROM, no bony abnormality . Psychiatric:  flat mood and affect, speech fluent and appropriate, AOx3 . Neurologic:  CN 2-12 grossly intact, moves all extremities in coordinated fashion, sensation intact    Radiological Exams on Admission: Ct Angio Head W Or Wo Contrast  Result Date: 09/12/2018 CLINICAL DATA:  Seizure. Not following commands. Rightward gaze. History of prostate cancer. EXAM: CT ANGIOGRAPHY HEAD AND NECK CT PERFUSION BRAIN TECHNIQUE: Multidetector CT imaging of the head and neck was performed using the standard protocol during bolus administration of intravenous contrast. Multiplanar CT image reconstructions and MIPs were obtained to evaluate the vascular anatomy. Carotid stenosis measurements (when applicable) are obtained utilizing NASCET criteria, using the distal internal carotid diameter as the denominator. Multiphase CT imaging of the brain was performed following IV bolus contrast injection. Subsequent parametric perfusion maps were calculated  using RAPID software. CONTRAST:  117mL ISOVUE-370 IOPAMIDOL (ISOVUE-370) INJECTION 76% COMPARISON:  None. FINDINGS: CTA NECK FINDINGS Aortic arch: Standard 3 vessel aortic arch with widely patent arch vessel origins. Right carotid system: Patent with mild calcified and soft plaque at the carotid bifurcation and in the distal cervical ICA. No evidence of dissection or stenosis. Left carotid system: Patent with minimal soft plaque at the carotid bifurcation. No evidence of dissection or stenosis. Vertebral arteries: Patent without evidence dissection or stenosis. Strongly dominant left vertebral artery. Skeleton: T2 superior endplate Schmorl's node. Degenerative endplate changes with small Schmorl's nodes at C6-7. Bilateral neural foraminal stenosis at C6-7 due to uncovertebral spurring. No suspicious osseous lesion. Other neck: No evidence of acute abnormality or mass. Upper chest: No apical lung consolidation or mass. Review of the MIP images confirms the above findings CTA HEAD FINDINGS Anterior circulation: The internal carotid arteries are patent from skull base to carotid termini with mild nonstenotic atherosclerotic plaque noted on the left. ACAs and MCAs are patent without evidence of proximal branch occlusion or significant stenosis. No aneurysm is identified. Posterior circulation: The intracranial left vertebral artery is widely patent and supplies the basilar. The right vertebral artery is hypoplastic and terminates in PICA. Patent PICA, AICA, and SCA origins are identified bilaterally. The basilar artery is patent and congenitally small without significant focal stenosis. There are fetal origins of both PCAs without evidence of significant PCA stenosis. No aneurysm is identified. Venous sinuses: Patent. Anatomic variants: Fetal origins of both PCAs. Delayed phase: No abnormal enhancement identified with note made of incomplete imaging through the inferior portion of the posterior fossa. Review of the MIP  images confirms the above findings CT Brain Perfusion Findings: CBF (<30%) Volume: 0 mL Perfusion (Tmax>6.0s) volume: 0 mL Mismatch Volume: 0 mL Infarction Location: None IMPRESSION: 1. No emergent large vessel occlusion. 2. Minimal  atherosclerosis in the head and neck without major branch occlusion, significant stenosis, or aneurysm. 3. No infarct or penumbra identified on perfusion imaging. Preliminary findings of no large vessel occlusion communicated in person to Dr. Rory Percy on 09/12/2018 at 9:30 a.m. Electronically Signed   By: Logan Bores M.D.   On: 09/12/2018 09:53   Ct Angio Neck W Or Wo Contrast  Result Date: 09/12/2018 CLINICAL DATA:  Seizure. Not following commands. Rightward gaze. History of prostate cancer. EXAM: CT ANGIOGRAPHY HEAD AND NECK CT PERFUSION BRAIN TECHNIQUE: Multidetector CT imaging of the head and neck was performed using the standard protocol during bolus administration of intravenous contrast. Multiplanar CT image reconstructions and MIPs were obtained to evaluate the vascular anatomy. Carotid stenosis measurements (when applicable) are obtained utilizing NASCET criteria, using the distal internal carotid diameter as the denominator. Multiphase CT imaging of the brain was performed following IV bolus contrast injection. Subsequent parametric perfusion maps were calculated using RAPID software. CONTRAST:  188mL ISOVUE-370 IOPAMIDOL (ISOVUE-370) INJECTION 76% COMPARISON:  None. FINDINGS: CTA NECK FINDINGS Aortic arch: Standard 3 vessel aortic arch with widely patent arch vessel origins. Right carotid system: Patent with mild calcified and soft plaque at the carotid bifurcation and in the distal cervical ICA. No evidence of dissection or stenosis. Left carotid system: Patent with minimal soft plaque at the carotid bifurcation. No evidence of dissection or stenosis. Vertebral arteries: Patent without evidence dissection or stenosis. Strongly dominant left vertebral artery. Skeleton: T2  superior endplate Schmorl's node. Degenerative endplate changes with small Schmorl's nodes at C6-7. Bilateral neural foraminal stenosis at C6-7 due to uncovertebral spurring. No suspicious osseous lesion. Other neck: No evidence of acute abnormality or mass. Upper chest: No apical lung consolidation or mass. Review of the MIP images confirms the above findings CTA HEAD FINDINGS Anterior circulation: The internal carotid arteries are patent from skull base to carotid termini with mild nonstenotic atherosclerotic plaque noted on the left. ACAs and MCAs are patent without evidence of proximal branch occlusion or significant stenosis. No aneurysm is identified. Posterior circulation: The intracranial left vertebral artery is widely patent and supplies the basilar. The right vertebral artery is hypoplastic and terminates in PICA. Patent PICA, AICA, and SCA origins are identified bilaterally. The basilar artery is patent and congenitally small without significant focal stenosis. There are fetal origins of both PCAs without evidence of significant PCA stenosis. No aneurysm is identified. Venous sinuses: Patent. Anatomic variants: Fetal origins of both PCAs. Delayed phase: No abnormal enhancement identified with note made of incomplete imaging through the inferior portion of the posterior fossa. Review of the MIP images confirms the above findings CT Brain Perfusion Findings: CBF (<30%) Volume: 0 mL Perfusion (Tmax>6.0s) volume: 0 mL Mismatch Volume: 0 mL Infarction Location: None IMPRESSION: 1. No emergent large vessel occlusion. 2. Minimal atherosclerosis in the head and neck without major branch occlusion, significant stenosis, or aneurysm. 3. No infarct or penumbra identified on perfusion imaging. Preliminary findings of no large vessel occlusion communicated in person to Dr. Rory Percy on 09/12/2018 at 9:30 a.m. Electronically Signed   By: Logan Bores M.D.   On: 09/12/2018 09:53   Mr Jeri Cos GM Contrast  Result Date:  09/12/2018 CLINICAL DATA:  Seizure. Right sided neglect. Right-sided field cut, aphasia EXAM: MRI HEAD WITHOUT AND WITH CONTRAST TECHNIQUE: Multiplanar, multiecho pulse sequences of the brain and surrounding structures were obtained without and with intravenous contrast. CONTRAST:  10 mL Gadovist IV COMPARISON:  CTA and CT perfusion 09/12/2018 FINDINGS: Brain: Patchy  restricted diffusion in the posterior left parietal lobe compatible with acute infarct in the left MCA territory. No other acute infarct. Negative for hemorrhage or mass. Ventricle size and cerebral volume normal. Patchy white matter hyperintensity bilaterally appears chronic. Brainstem normal. Negative for hemorrhage or mass. Normal enhancement postcontrast administration. Vascular: Normal arterial flow voids Skull and upper cervical spine: Negative Sinuses/Orbits: Mild mucosal edema paranasal sinuses. Normal orbit Other: None IMPRESSION: Small, patchy acute infarct in the left posterior parietal lobe Mild chronic microvascular ischemic changes in the white matter. Electronically Signed   By: Franchot Gallo M.D.   On: 09/12/2018 15:40   Ct Cerebral Perfusion W Contrast  Result Date: 09/12/2018 CLINICAL DATA:  Seizure. Not following commands. Rightward gaze. History of prostate cancer. EXAM: CT ANGIOGRAPHY HEAD AND NECK CT PERFUSION BRAIN TECHNIQUE: Multidetector CT imaging of the head and neck was performed using the standard protocol during bolus administration of intravenous contrast. Multiplanar CT image reconstructions and MIPs were obtained to evaluate the vascular anatomy. Carotid stenosis measurements (when applicable) are obtained utilizing NASCET criteria, using the distal internal carotid diameter as the denominator. Multiphase CT imaging of the brain was performed following IV bolus contrast injection. Subsequent parametric perfusion maps were calculated using RAPID software. CONTRAST:  127mL ISOVUE-370 IOPAMIDOL (ISOVUE-370) INJECTION  76% COMPARISON:  None. FINDINGS: CTA NECK FINDINGS Aortic arch: Standard 3 vessel aortic arch with widely patent arch vessel origins. Right carotid system: Patent with mild calcified and soft plaque at the carotid bifurcation and in the distal cervical ICA. No evidence of dissection or stenosis. Left carotid system: Patent with minimal soft plaque at the carotid bifurcation. No evidence of dissection or stenosis. Vertebral arteries: Patent without evidence dissection or stenosis. Strongly dominant left vertebral artery. Skeleton: T2 superior endplate Schmorl's node. Degenerative endplate changes with small Schmorl's nodes at C6-7. Bilateral neural foraminal stenosis at C6-7 due to uncovertebral spurring. No suspicious osseous lesion. Other neck: No evidence of acute abnormality or mass. Upper chest: No apical lung consolidation or mass. Review of the MIP images confirms the above findings CTA HEAD FINDINGS Anterior circulation: The internal carotid arteries are patent from skull base to carotid termini with mild nonstenotic atherosclerotic plaque noted on the left. ACAs and MCAs are patent without evidence of proximal branch occlusion or significant stenosis. No aneurysm is identified. Posterior circulation: The intracranial left vertebral artery is widely patent and supplies the basilar. The right vertebral artery is hypoplastic and terminates in PICA. Patent PICA, AICA, and SCA origins are identified bilaterally. The basilar artery is patent and congenitally small without significant focal stenosis. There are fetal origins of both PCAs without evidence of significant PCA stenosis. No aneurysm is identified. Venous sinuses: Patent. Anatomic variants: Fetal origins of both PCAs. Delayed phase: No abnormal enhancement identified with note made of incomplete imaging through the inferior portion of the posterior fossa. Review of the MIP images confirms the above findings CT Brain Perfusion Findings: CBF (<30%) Volume:  0 mL Perfusion (Tmax>6.0s) volume: 0 mL Mismatch Volume: 0 mL Infarction Location: None IMPRESSION: 1. No emergent large vessel occlusion. 2. Minimal atherosclerosis in the head and neck without major branch occlusion, significant stenosis, or aneurysm. 3. No infarct or penumbra identified on perfusion imaging. Preliminary findings of no large vessel occlusion communicated in person to Dr. Rory Percy on 09/12/2018 at 9:30 a.m. Electronically Signed   By: Logan Bores M.D.   On: 09/12/2018 09:53   Ct Head Code Stroke Wo Contrast  Result Date: 09/12/2018 CLINICAL DATA:  Code stroke.  Unable to follow commands EXAM: CT HEAD WITHOUT CONTRAST TECHNIQUE: Contiguous axial images were obtained from the base of the skull through the vertex without intravenous contrast. COMPARISON:  None. FINDINGS: Brain: No evidence of acute infarction, hemorrhage, hydrocephalus, extra-axial collection or mass lesion/mass effect. Vascular: No hyperdense vessel or unexpected calcification. Skull: Normal. Negative for fracture or focal lesion. Sinuses/Orbits: Mild patchy mucosal thickening in the paranasal sinuses. Other: These results were communicated to Dr. Rory Percy at [place] 1/30/2020by text page via the [facility] messaging system. Not scored with this history IMPRESSION: Negative head CT. Electronically Signed   By: Monte Fantasia M.D.   On: 09/12/2018 09:19    EKG: Independently reviewed.  NSR with rate 70; no evidence of acute ischemia   Labs on Admission: I have personally reviewed the available labs and imaging studies at the time of the admission.  Pertinent labs:   Glucose 126 Troponin 0.00 Normal CBC INR 1.08 ETOH <10  Assessment/Plan Principal Problem:   CVA (cerebral vascular accident) ([place]) Active Problems:   Malignant neoplasm of prostate ([place])   Seizure ([place])   Essential hypertension   Diabetes mellitus type 2 in nonobese [facility])   CVA -Patient with right UE weakness/numbness intermittently for several days  with periods of confusion -Marked confusion this AM and 2 reported generalized tonic-clonic seizures prior to arrival -MRI indicates + R parietal CVA -Will admit for further CVA evaluation -Telemetry monitoring -Echo -Risk stratification with FLP, A1c; will also check TSH and UDS -ASA daily -Neurology consult -PT/OT/ST/Nutrition Consults  HTN -Allow permissive HTN for now -Treat BP only if >220/120, and then with goal of 15% reduction -Hold ARB and BB and plan to restart in 48-72 hours   HLD -Check FLP -Resume statin but will change Zocor to Lipitor 80 mg daily   DM -A1c 6.1, good control -Hold metformin -Will order moderate-scale SSI   Seizure -Patient without known h/o seizures -2 reported seizures prior to arrival -EEG abnormal but not diagnostic -Neurology has seen the patient -He has been loaded with and started on Keppra -He will likely need driving restriction for at least 6 months - which is devastating to him based on him working 2 jobs including one which requires driving; will defer to the stroke team -Seizure precautions -Ativan prn  Prostate CA  -Treated with seed radiation without apparent recurrence  Connective tissue disease -Continue Plaquenil and folate -Hold methotrexate for now - due again tomorrow and this may be appropriate   DVT prophylaxis:  Lovenox  Code Status: DNR - confirmed with patient/family Family Communication: Wife and Daughter present throughout evaluation Disposition Plan:  Home once clinically improved Consults called: Neurology; PT/OT/ST/Nutrition  Admission status: Admit - It is my clinical opinion that admission to INPATIENT is reasonable and necessary because of the expectation that this patient will require hospital care that crosses at least 2 midnights to treat this condition based on the medical complexity of the problems presented.  Given the aforementioned information, the predictability of an adverse outcome is felt to be  significant.    Karmen Bongo MD [facility]   How to contact the [facility] Attending or Consulting provider [place] or covering provider during after hours [place], for this patient?  1. Check the care team in [facility] and look for a) attending/consulting [facility] provider listed and b) the [facility] team listed 2. Log into [email or web address] and use [place]'s universal password to access. If you do not have the password, please  contact the hospital operator. 3. Locate the [facility] provider you are looking for under [facility] and page to a number that you can be directly reached. 4. If you still have difficulty reaching the provider, please page the [facility] (Director on Call) for the Hospitalists listed on amion for assistance.   09/12/2018, 5:04 PM

## 2018-09-12 NOTE — Progress Notes (Signed)
Same-day progress note EEG shows generalized slowing.  No evidence of status epilepticus. MRI shows small patchy acute infarct in the left posterior parietal lobe. I would recommend stroke work-up going forward. Stroke team will follow in the morning.   Updated assessment: Likely seizure as presentation of cortical strokes in the left posterior parietal cortex.  UPDATED RECS -Admit to hospitalist -Telemetry monitoring -Allow for permissive hypertension for the first 24-48h - only treat PRN if SBP >220 mmHg. Blood pressures can be gradually normalized to SBP<140 upon discharge. -Echocardiogram -HgbA1c, fasting lipid panel -Frequent neuro checks -Prophylactic therapy-Antiplatelet med: Aspirin - dose 325mg  PO or 300mg  PR -Atorvastatin 80 mg PO daily -Risk factor modification -PT consult, OT consult, Speech consult -If Afib found on telemetry, will need anticoagulation. Decision pending imaging and stroke team rounding.  Please page stroke NP/PA/MD (listed on AMION)  from 8am-4 pm as this patient will be followed by the stroke team at this point.  Per [facility] statutes, patients with seizures are not allowed to drive until they have been seizure-free for six months.   Use caution when using heavy equipment or power tools. Avoid working on ladders or at heights. Take showers instead of baths. Ensure the water temperature is not too high on the home water heater. Do not go swimming alone. Do not lock yourself in a room alone (i.e. bathroom). When caring for infants or small children, sit down when holding, feeding, or changing them to minimize risk of injury to the child in the event you have a seizure. Maintain good sleep hygiene. Avoid alcohol.   If patient has another seizure, call 911 and bring them back to the ED if: A. The seizure lasts longer than 5 minutes.  B. The patient doesn't wake shortly after the seizure or has new problems such as difficulty seeing, speaking  or moving following the seizure C. The patient was injured during the seizure D. The patient has a temperature over 102 F (39C) E. The patient vomited during the seizure and now is having trouble breathing  -- Amie Portland, MD [facility] Pager: [phone] If 7pm to 7am, please call on call as listed on AMION.

## 2018-09-12 NOTE — Progress Notes (Addendum)
Initial Nutrition Assessment  DOCUMENTATION CODES:   Not applicable  INTERVENTION:  Ensure Enlive po BID, each supplement provides 350 kcal and 20 grams of protein  Encouraged high protein foods and overall healthy PO intake  NUTRITION DIAGNOSIS:   Inadequate oral intake related to poor appetite as evidenced by estimated needs.  GOAL:   Patient will meet greater than or equal to 90% of their needs  MONITOR:   PO intake, Supplement acceptance  REASON FOR ASSESSMENT:   Consult Other (Comment)(TIA)  ASSESSMENT:   Pt with PMH of HTN, prostate cancer, and T2DM. Presented to ED with stroke and seizure like symptoms. He is experiencing right sided weakness following a seizure.  Spoke with pt at bedside, his wife and daughter were both present. Pt was alert and awake. Pt reports overall appetite and energy levels are okay. Wife was concerned about him having a decreased appetite, but mentioned he works 2 jobs and that affects how often he is able to eat.  Pt reports that he only has 1 meal a day which is very limited. It is often just a sandwich, oatmeal or cereal. Wife mentioned that occasionally she will be able to fix him breakfast (cereal with bananas, blueberries and eggs on the side) but he leaves for work so early that he does not want her getting up. Work for him begins at [employer] and lasts until 10pm at night. He wants to continue working but expressed how he is often exhausted.  Pt reports no changes in wt, but his wife said he lost about 10# ~8-37months back. UBW 220#. He can complete ADL w/o difficulty. Denies n/v, taste changes or SOB.   NFPE revealed pt does not have malnutrition. Explained to family that since he is so active he really needs to eat full meals throughout the day or snack very often. Offered to order [employer]; pt was receptive and willing to drink them. Encouraged high protein foods (chicken, peanut butter, fish, eggs) to assist with  healing.  Medications reviewed and include: insulin aspart 0-15 units 3x daily, insulin aspart 0-5 units at bedtime Labs reviewed: CBG 122  NUTRITION - FOCUSED PHYSICAL EXAM:    Most Recent Value  Orbital Region  No depletion  Upper Arm Region  No depletion  Thoracic and Lumbar Region  No depletion  Buccal Region  No depletion  Temple Region  Mild depletion  Clavicle Bone Region  Mild depletion  Clavicle and Acromion Bone Region  No depletion  Scapular Bone Region  No depletion  Dorsal Hand  Mild depletion  Patellar Region  Mild depletion  Anterior Thigh Region  Mild depletion  Posterior Calf Region  Mild depletion  Edema (RD Assessment)  None  Hair  Reviewed  Eyes  Reviewed  Mouth  Reviewed  Skin  Reviewed  Nails  Reviewed     Diet Order:   Diet Order            Diet heart healthy/carb modified Room service appropriate? Yes; Fluid consistency: Thin  Diet effective ____             EDUCATION NEEDS:   Education needs have been addressed  Skin:  Skin Assessment: Reviewed RN Assessment  Last BM:  1/29  Height:   Ht Readings from Last 1 Encounters:  09/12/18 6\' 4"  (1.93 m)    Weight:   Wt Readings from Last 1 Encounters:  09/12/18 101.7 kg    Ideal Body Weight:  91.81 kg  BMI:  Body  mass index is 27.29 kg/m.  Estimated Nutritional Needs:   Kcal:  2450-2650kcal  Protein:  122-132g  Fluid:  >/=2.[place] Dietetic Intern

## 2018-09-12 NOTE — Code Documentation (Signed)
68 yo male coming from work where he was noted to be confused and not able to follow commands. Pt was last seen normal by his wife last night when he got to work at 2230. Pt reports getting home, eating a sandwich, and going to bed. Pt woke up this mornign and per his wife was grumpy and disoriented. She tried to get him to come to ED, but he insisted on going to work. Once at work, staff noted changes and called EMS. EMS activated a Code Stroke. Pt had Grand Mal seizure that lasted 1-2 minutes per paramedic while in transport. 2 mg of Versed given and seizure subsided. Upon arrival to the ED, pts initial NIHSS 10 due to inbability to follow commands, answer questions, aphasia, dysarthria, neglect of the right and right visual cut. CT Head negative for hemorrhage. Pt unable to get tPA due to LKW < 4.5 hours. CTA/CTP showed no sign of LVO. Seizure suspected and not stroke. Brought back to ED and placed on cardiac monitor. Repeat NIHSS 4 due to some right visual deficits and slight aphasia. Pt to be admitted for observation. Handoff given to [place], RN.

## 2018-09-12 NOTE — Procedures (Signed)
ELECTROENCEPHALOGRAM REPORT   Patient: Peter Martinez       Room #: 4U98J EEG No. ID: [id number] Age: 68 y.o.        Sex: male Referring Physician: Lorin Mercy Report Date:  09/12/2018        Interpreting Physician: Alexis Goodell  History: Peter Martinez is an 68 y.o. male with new onset seizure  Medications:  Zyloprim, ASA, Folvite, Plaquenil, Insulin, Keppra  Conditions of Recording:  This is a 21 channel routine scalp EEG performed with bipolar and monopolar montages arranged in accordance to the international 10/20 system of electrode placement. One channel was dedicated to EKG recording.  The patient is in the awake, drowsy and asleep states.  Description:  The waking background activity consists of a low voltage, symmetrical, fairly well organized, 7 Hz theta activity, seen from the parieto-occipital and posterior temporal regions.  Low voltage fast activity, poorly organized, is seen anteriorly and is at times superimposed on more posterior regions.  A mixture of theta and alpha rhythms are seen from the central and temporal regions. The patient drowses with slowing to irregular, low voltage theta and beta activity.   The patient goes in to a light sleep with symmetrical sleep spindles, vertex central sharp transients and irregular slow activity.  No epileptiform activity is noted.   Hyperventilation and intermittent photic stimulation were not performed.   IMPRESSION: This is an abnormal EEG secondary to mild posterior background slowing.  This finding may be seen with a diffuse gray matter disturbance that is etiologically nonspecific, but may include a dementia, among other possibilities.  No epileptiform activity is noted.     Alexis Goodell, MD Neurology [phone] 09/12/2018, 2:10 PM

## 2018-09-12 NOTE — Progress Notes (Signed)
EEG completed; results pending.    

## 2018-09-12 NOTE — Consult Note (Addendum)
Neurology Consultation  Reason for Consult: Code stroke Referring Physician: Dr. Ronnald Nian  CC: Right-sided weakness, seizure  History is obtained from: Patient, chart  HPI: Peter Martinez is a 68 y.o. male who has a past medical history of diabetes, prostate cancer, hypertension, presented to the emergency room via [facility] with last known normal to the best of her history taking at 10:30 PM last night.  He came late from work appeared very crampy according to his wife, slept, woke up in the morning and went to work where he had a witnessed generalized tonic-clonic seizure.  He was brought in in the ambulance had another dermatological seizure in the ambulance with a rightward gaze according to the description from EMS. On initial examination, NIH stroke scale was 10 which improved to a 4.  He has residual right-sided neglect, right-sided field cut and some aphasia. He is not able to provide reliable history but was able to tell me that he has been treated for his prostate cancer by Dr. Tammi Klippel.   LKW: 10:30 PM on 09/11/2018 tpa given?: no, likely seizure Premorbid modified Rankin scale (mRS):0 ROS: ROS was performed and is negative except as noted in the HPI.  Past Medical History:  Diagnosis Date  . Anemia   . Arthritis    psoriatic  . Cancer [facility])    prostate  . Diabetes mellitus   . Gout   . H/O mixed connective tissue disease   . History of kidney stones   . Hypertension   . Kidney stones   . Wears glasses   . Wears partial dentures    upper   No family history on file.  Social History:   reports that he has never smoked. He has never used smokeless tobacco. He reports that he does not drink alcohol or use drugs.  Medications No current facility-administered medications for this encounter.   Current Outpatient Medications:  .  acetaminophen (TYLENOL) 500 MG tablet, Take 1,000 mg by mouth as needed., Disp: , Rfl:  .  allopurinol (ZYLOPRIM) 300 MG tablet, Take 300  mg by mouth daily., Disp: , Rfl:  .  aspirin EC 81 MG tablet, Take 81 mg by mouth daily., Disp: , Rfl:  .  cholecalciferol (VITAMIN D) 1000 units tablet, Take 5,000 Units by mouth at bedtime., Disp: , Rfl:  .  colchicine 0.6 MG tablet, One tab po q 2 hrs until pain relief or stomach upset (Patient taking differently: Take 0.6 mg by mouth 2 (two) times daily as needed. One tab po q 2 hrs until pain relief or stomach upset), Disp: 12 tablet, Rfl: 0 .  Cyanocobalamin (B-12) 2500 MCG TABS, Take 2,500 mcg by mouth daily., Disp: , Rfl:  .  folic acid (FOLVITE) 1 MG tablet, Take 1 mg by mouth daily., Disp: , Rfl:  .  HYDROcodone-acetaminophen (NORCO) 5-325 MG tablet, Take 1 tablet by mouth every 6 (six) hours as needed for moderate pain., Disp: 25 tablet, Rfl: 0 .  hydroxychloroquine (PLAQUENIL) 200 MG tablet, Take 200 mg by mouth 2 (two) times daily., Disp: , Rfl:  .  losartan (COZAAR) 100 MG tablet, Take 100 mg by mouth at bedtime. , Disp: , Rfl:  .  metFORMIN (GLUCOPHAGE) 1000 MG tablet, Take 1,000 mg by mouth 2 (two) times daily with a meal., Disp: , Rfl:  .  methotrexate (RHEUMATREX) 2.5 MG tablet, Take 20 mg by mouth once a week. On Fridays  8 tablets, Disp: , Rfl:  .  metoprolol  tartrate (LOPRESSOR) 50 MG tablet, Take 50 mg by mouth 2 (two) times daily. , Disp: , Rfl:  .  simvastatin (ZOCOR) 10 MG tablet, Take 10 mg by mouth at bedtime. , Disp: , Rfl:   Exam: Current vital signs: BP (!) 148/93   Pulse 69   Temp 98.1 F (36.7 C) (Oral)   Ht 6' (1.829 m)   Wt 90.7 kg   SpO2 100%   BMI 27.12 kg/m  Vital signs in last 24 hours: Temp:  [98.1 F (36.7 C)] 98.1 F (36.7 C) (01/30 0941) Pulse Rate:  [69] 69 (01/30 0930) BP: (148)/(93) 148/93 (01/30 0930) SpO2:  [100 %] 100 % (01/30 0930) Weight:  [90.7 kg] 90.7 kg (01/30 0939) Initial examination on the bridge General: Awake alert in no distress HEENT: Normocephalic atraumatic dry mucous membranes Respiratory: Clear to  auscultation Cardiovascular: S1-2 heard regular rate rhythm Abdomen soft nondistended nontender Extremities warm well perfused Neurological exam Patient was awake alert oriented to self, could not tell me the correct age, could not tell me the month but not the year. Speech is mildly dysarthric. He inconsistently follows commands but was unable to follow complex commands whatsoever. Cranial nerves: Pupils equal round reactive light, no gaze preference, visual fields exam showed a possible right homonymous hemianopsia/quadrantanopsia, face is symmetric, palate midline, tongue midline. Motor exam: No drift in any of the extremities and 5/5 strength all over. Sensory exam: Intact all over Coordination: Mild dysmetria on the left heel-knee-shin but otherwise intact. Gait testing was deferred at this time NIH stroke scale- 10   Repeat exam with improvement of the NIH stroke scale to 4   Labs I have reviewed labs in epic and the results pertinent to this consultation are:  CBC    Component Value Date/Time   WBC 5.4 09/12/2018 0905   RBC 4.56 09/12/2018 0905   HGB 13.9 09/12/2018 0905   HCT 44.2 09/12/2018 0905   PLT 197 09/12/2018 0905   MCV 96.9 09/12/2018 0905   MCH 30.5 09/12/2018 0905   MCHC 31.4 09/12/2018 0905   RDW 14.9 09/12/2018 0905   LYMPHSABS 0.8 09/12/2018 0905   MONOABS 0.7 09/12/2018 0905   EOSABS 0.1 09/12/2018 0905   BASOSABS 0.0 09/12/2018 0905    CMP     Component Value Date/Time   NA 138 05/07/2017 1117   K 4.7 05/07/2017 1117   CL 101 05/07/2017 1117   CO2 29 05/07/2017 1117   GLUCOSE 95 05/07/2017 1117   BUN 14 05/07/2017 1117   CREATININE 1.20 09/12/2018 0911   CALCIUM 9.4 05/07/2017 1117   PROT 7.8 10/20/2016 1312   ALBUMIN 4.3 10/20/2016 1312   AST 18 10/20/2016 1312   ALT 27 10/20/2016 1312   ALKPHOS 81 10/20/2016 1312   BILITOT 1.2 10/20/2016 1312   GFRNONAA >60 05/07/2017 1117   GFRAA >60 05/07/2017 1117   Imaging I have reviewed the  images obtained:  CT-scan of the brain-no acute changes CTA head and neck-no LVO. CT perfusion-no perfusion deficit.  Assessment:  68 year old man presenting with right-sided weakness following a seizure. Most likely postictal Todd's paralysis from the seizure. Given the history of cancer, further imaging is required to ensure we rule out metastases in the brain. He still not back to 100% baseline and EEG would be helpful to see if he is in status or is having intermittent seizures still.  Has multiple risk factors for strokes, will benefit from imaging and observation.  No TPA because of last  known normal being outside the window as well as presentation more consistent with a seizure.  No evidence of LVO on imaging.  Hence not a candidate for endovascular thrombectomy.  Impression: New onset multiple seizures.  Concern for status epilepticus. Evaluate for underlying etiology of seizures Evaluate for stroke  Recommendations: Admit for observation MRI brain with and without contrast EEG Load with Keppra 1500 mg IV x1 followed by 500 mg twice daily Maintain seizure precautions If the MRI is positive for stroke, will then pursue stroke work-up.  [place] state law prohibits driving in patients with seizure unless they have been seizure-free for 6 months.  This should be on patient discharge papers and should be discussed with the patient's when he is more coherent and the aphasia resolves. No family at the bedside to discuss. Plan was discussed with the ED provider in detail.   -- Amie Portland, MD [facility] Pager: [phone] If 7pm to 7am, please call on call as listed on AMION.   CRITICAL CARE ATTESTATION Performed by: Amie Portland, MD Total critical care time: 45 minutes Critical care time was exclusive of separately billable procedures and treating other patients and/or supervising APPs/Residents/Students Critical care was necessary to treat or  prevent imminent or life-threatening deterioration due to concern for stroke, generalized tonic-clonic seizures, concern for status epilepticus and postictal confusion. This patient is critically ill and at significant risk for neurological worsening and/or death and care requires constant monitoring. Critical care was time spent personally by me on the following activities: development of treatment plan with patient and/or surrogate as well as nursing, discussions with consultants, evaluation of patient's response to treatment, examination of patient, obtaining history from patient or surrogate, ordering and performing treatments and interventions, ordering and review of laboratory studies, ordering and review of radiographic studies, pulse oximetry, re-evaluation of patient's condition, participation in multidisciplinary rounds and medical decision making of high complexity in the care of this patient.

## 2018-09-12 NOTE — Procedures (Signed)
Echo attempted. Patient in MRI.  

## 2018-09-12 NOTE — ED Triage Notes (Signed)
Pt brought in by ems for code stroke ; last known well time was last night approx. 1030 pm last night ; ems states wife noticed this morning he was disoriented and "grumpy" but still went to work like hat ; ems reports patient had a grand mal seizure that lasted around 1-2 min prior to arrival ; pt received 2 mg of versed prior to arrival ; pt alert but not following simple commands

## 2018-09-12 NOTE — ED Provider Notes (Addendum)
[facility] EMERGENCY DEPARTMENT Provider Note   CSN: [id number] Arrival date & time: 09/12/18  1517     History   Chief Complaint Chief Complaint  Patient presents with  . Code Stroke    HPI Peter Martinez is a 68 y.o. male.  atient arrives by EMS as a code stroke.  Last well-known was last night around 10:30 PM.  EMS states that wife noticed that patient was disoriented and kind of grumpy.  Patient with seizure-like activity with EMS.  Patient has right-sided neglect, right-sided field cut and some aphasia.  Unable to fully give history.  Patient was given 2 of Versed in route.   The history is provided by the patient and the EMS personnel.  Neurologic Problem  This is a new problem. The current episode started 6 to 12 hours ago. The problem occurs constantly. The problem has not changed since onset.Pertinent negatives include no chest pain, no abdominal pain, no headaches and no shortness of breath. Nothing aggravates the symptoms. Nothing relieves the symptoms. He has tried nothing for the symptoms. The treatment provided no relief.    Past Medical History:  Diagnosis Date  . Anemia   . Arthritis    psoriatic  . Cancer [facility])    prostate  . Diabetes mellitus   . Gout   . H/O mixed connective tissue disease   . History of kidney stones   . Hypertension   . Kidney stones   . Wears glasses   . Wears partial dentures    upper    Patient Active Problem List   Diagnosis Date Noted  . Sebaceous cyst   . Malignant neoplasm of prostate ([place]) 08/25/2016    Past Surgical History:  Procedure Laterality Date  . MASS EXCISION N/A 05/11/2017   Procedure: EXCISION OF CYST ON SCALP;  Surgeon: Aviva Signs, MD;  Location: [facility];  Service: General;  Laterality: N/A;  . RADIOACTIVE SEED IMPLANT N/A 10/27/2016   Procedure: RADIOACTIVE SEED IMPLANT/BRACHYTHERAPY IMPLANT;  Surgeon: Alexis Frock, MD;  Location: [facility];  Service: Urology;   Laterality: N/A;  . TONSILLECTOMY          Home Medications    Prior to Admission medications   Medication Sig Start Date End Date Taking? Authorizing Provider  acetaminophen (TYLENOL) 500 MG tablet Take 1,000 mg by mouth as needed.    [provider]  allopurinol (ZYLOPRIM) 300 MG tablet Take 300 mg by mouth daily.    [provider]  aspirin EC 81 MG tablet Take 81 mg by mouth daily.    [provider]  cholecalciferol (VITAMIN D) 1000 units tablet Take 5,000 Units by mouth at bedtime.    [provider]  colchicine 0.6 MG tablet One tab po q 2 hrs until pain relief or stomach upset Patient taking differently: Take 0.6 mg by mouth 2 (two) times daily as needed. One tab po q 2 hrs until pain relief or stomach upset 02/29/12   Triplett, Tammy, PA-C  Cyanocobalamin (B-12) 2500 MCG TABS Take 2,500 mcg by mouth daily.    [provider]  folic acid (FOLVITE) 1 MG tablet Take 1 mg by mouth daily.    [provider]  HYDROcodone-acetaminophen (NORCO) 5-325 MG tablet Take 1 tablet by mouth every 6 (six) hours as needed for moderate pain. 05/11/17   Aviva Signs, MD  hydroxychloroquine (PLAQUENIL) 200 MG tablet Take 200 mg by mouth 2 (two) times daily.  [provider]  losartan (COZAAR) 100 MG tablet Take 100 mg by mouth at bedtime.     [provider]  metFORMIN (GLUCOPHAGE) 1000 MG tablet Take 1,000 mg by mouth 2 (two) times daily with a meal.    [provider]  methotrexate (RHEUMATREX) 2.5 MG tablet Take 20 mg by mouth once a week. On Fridays  8 tablets    [provider]  metoprolol tartrate (LOPRESSOR) 50 MG tablet Take 50 mg by mouth 2 (two) times daily.     [provider]  simvastatin (ZOCOR) 10 MG tablet Take 10 mg by mouth at bedtime.     [provider]    Family History No family history on file.  Social History Social History   Tobacco Use  . Smoking status: Never  Smoker  . Smokeless tobacco: Never Used  Substance Use Topics  . Alcohol use: No  . Drug use: No     Allergies   Patient has no known allergies.   Review of Systems Review of Systems  Constitutional: Negative for chills and fever.  HENT: Negative for ear pain and sore throat.   Eyes: Negative for pain and visual disturbance.  Respiratory: Negative for cough and shortness of breath.   Cardiovascular: Negative for chest pain and palpitations.  Gastrointestinal: Negative for abdominal pain and vomiting.  Genitourinary: Negative for dysuria and hematuria.  Musculoskeletal: Negative for arthralgias and back pain.  Skin: Negative for color change and rash.  Neurological: Positive for seizures and speech difficulty. Negative for tremors, syncope, weakness and headaches.  Psychiatric/Behavioral: Positive for confusion and decreased concentration.  All other systems reviewed and are negative.    Physical Exam Updated Vital Signs  ED Triage Vitals  Enc Vitals Group     BP 09/12/18 0930 (!) 148/93     Pulse Rate 09/12/18 0930 69     Resp --      Temp 09/12/18 0941 98.1 F (36.7 C)     Temp Source 09/12/18 0941 Oral     SpO2 09/12/18 0930 100 %     Weight 09/12/18 0939 200 lb (90.7 kg)     Height 09/12/18 0939 6' (1.829 m)     Head Circumference --      Peak Flow --      Pain Score 09/12/18 0939 0     Pain Loc --      Pain Edu? --      Excl. in [place]? --     Physical Exam Vitals signs and nursing note reviewed.  Constitutional:      General: He is not in acute distress.    Appearance: He is well-developed. He is not ill-appearing.  HENT:     Head: Normocephalic and atraumatic.     Right Ear: Tympanic membrane normal.     Nose: Nose normal.     Mouth/Throat:     Mouth: Mucous membranes are moist.  Eyes:     Extraocular Movements: Extraocular movements intact.     Conjunctiva/sclera: Conjunctivae normal.     Pupils: Pupils are equal, round, and reactive to light.    Neck:     Musculoskeletal: Normal range of motion and neck supple.  Cardiovascular:     Rate and Rhythm: Normal rate and regular rhythm.     Heart sounds: No murmur.  Pulmonary:     Effort: Pulmonary effort is normal. No respiratory distress.     Breath sounds: Normal breath sounds.  Abdominal:  Palpations: Abdomen is soft.     Tenderness: There is no abdominal tenderness.  Skin:    General: Skin is warm and dry.  Neurological:     Mental Status: He is alert.     Cranial Nerves: No cranial nerve deficit.     Sensory: No sensory deficit.     Coordination: Coordination normal.     Comments: Confused, slow to follow commands, appears to have right-sided neglect, possible aphasia, strength and sensation appear grossly intact      ED Treatments / Results  Labs (all labs ordered are listed, but only abnormal results are displayed) Labs Reviewed  COMPREHENSIVE METABOLIC PANEL - Abnormal; Notable for the following components:      Result Value   Glucose, Bld 126 (*)    All other components within normal limits  CBG MONITORING, ED - Abnormal; Notable for the following components:   Glucose-Capillary 122 (*)    All other components within normal limits  ETHANOL  PROTIME-INR  APTT  CBC  DIFFERENTIAL  RAPID URINE DRUG SCREEN, HOSP PERFORMED  URINALYSIS, ROUTINE W REFLEX MICROSCOPIC  I-STAT TROPONIN, ED  I-STAT CREATININE, ED    EKG None  Radiology Ct Angio Head W Or Wo Contrast  Result Date: 09/12/2018 CLINICAL DATA:  Seizure. Not following commands. Rightward gaze. History of prostate cancer. EXAM: CT ANGIOGRAPHY HEAD AND NECK CT PERFUSION BRAIN TECHNIQUE: Multidetector CT imaging of the head and neck was performed using the standard protocol during bolus administration of intravenous contrast. Multiplanar CT image reconstructions and MIPs were obtained to evaluate the vascular anatomy. Carotid stenosis measurements (when applicable) are obtained utilizing NASCET  criteria, using the distal internal carotid diameter as the denominator. Multiphase CT imaging of the brain was performed following IV bolus contrast injection. Subsequent parametric perfusion maps were calculated using RAPID software. CONTRAST:  145mL ISOVUE-370 IOPAMIDOL (ISOVUE-370) INJECTION 76% COMPARISON:  None. FINDINGS: CTA NECK FINDINGS Aortic arch: Standard 3 vessel aortic arch with widely patent arch vessel origins. Right carotid system: Patent with mild calcified and soft plaque at the carotid bifurcation and in the distal cervical ICA. No evidence of dissection or stenosis. Left carotid system: Patent with minimal soft plaque at the carotid bifurcation. No evidence of dissection or stenosis. Vertebral arteries: Patent without evidence dissection or stenosis. Strongly dominant left vertebral artery. Skeleton: T2 superior endplate Schmorl's node. Degenerative endplate changes with small Schmorl's nodes at C6-7. Bilateral neural foraminal stenosis at C6-7 due to uncovertebral spurring. No suspicious osseous lesion. Other neck: No evidence of acute abnormality or mass. Upper chest: No apical lung consolidation or mass. Review of the MIP images confirms the above findings CTA HEAD FINDINGS Anterior circulation: The internal carotid arteries are patent from skull base to carotid termini with mild nonstenotic atherosclerotic plaque noted on the left. ACAs and MCAs are patent without evidence of proximal branch occlusion or significant stenosis. No aneurysm is identified. Posterior circulation: The intracranial left vertebral artery is widely patent and supplies the basilar. The right vertebral artery is hypoplastic and terminates in PICA. Patent PICA, AICA, and SCA origins are identified bilaterally. The basilar artery is patent and congenitally small without significant focal stenosis. There are fetal origins of both PCAs without evidence of significant PCA stenosis. No aneurysm is identified. Venous sinuses:  Patent. Anatomic variants: Fetal origins of both PCAs. Delayed phase: No abnormal enhancement identified with note made of incomplete imaging through the inferior portion of the posterior fossa. Review of the MIP images confirms the above findings CT  Brain Perfusion Findings: CBF (<30%) Volume: 0 mL Perfusion (Tmax>6.0s) volume: 0 mL Mismatch Volume: 0 mL Infarction Location: None IMPRESSION: 1. No emergent large vessel occlusion. 2. Minimal atherosclerosis in the head and neck without major branch occlusion, significant stenosis, or aneurysm. 3. No infarct or penumbra identified on perfusion imaging. Preliminary findings of no large vessel occlusion communicated in person to Dr. Rory Percy on 09/12/2018 at 9:30 a.m. Electronically Signed   By: Logan Bores M.D.   On: 09/12/2018 09:53   Ct Angio Neck W Or Wo Contrast  Result Date: 09/12/2018 CLINICAL DATA:  Seizure. Not following commands. Rightward gaze. History of prostate cancer. EXAM: CT ANGIOGRAPHY HEAD AND NECK CT PERFUSION BRAIN TECHNIQUE: Multidetector CT imaging of the head and neck was performed using the standard protocol during bolus administration of intravenous contrast. Multiplanar CT image reconstructions and MIPs were obtained to evaluate the vascular anatomy. Carotid stenosis measurements (when applicable) are obtained utilizing NASCET criteria, using the distal internal carotid diameter as the denominator. Multiphase CT imaging of the brain was performed following IV bolus contrast injection. Subsequent parametric perfusion maps were calculated using RAPID software. CONTRAST:  15mL ISOVUE-370 IOPAMIDOL (ISOVUE-370) INJECTION 76% COMPARISON:  None. FINDINGS: CTA NECK FINDINGS Aortic arch: Standard 3 vessel aortic arch with widely patent arch vessel origins. Right carotid system: Patent with mild calcified and soft plaque at the carotid bifurcation and in the distal cervical ICA. No evidence of dissection or stenosis. Left carotid system: Patent  with minimal soft plaque at the carotid bifurcation. No evidence of dissection or stenosis. Vertebral arteries: Patent without evidence dissection or stenosis. Strongly dominant left vertebral artery. Skeleton: T2 superior endplate Schmorl's node. Degenerative endplate changes with small Schmorl's nodes at C6-7. Bilateral neural foraminal stenosis at C6-7 due to uncovertebral spurring. No suspicious osseous lesion. Other neck: No evidence of acute abnormality or mass. Upper chest: No apical lung consolidation or mass. Review of the MIP images confirms the above findings CTA HEAD FINDINGS Anterior circulation: The internal carotid arteries are patent from skull base to carotid termini with mild nonstenotic atherosclerotic plaque noted on the left. ACAs and MCAs are patent without evidence of proximal branch occlusion or significant stenosis. No aneurysm is identified. Posterior circulation: The intracranial left vertebral artery is widely patent and supplies the basilar. The right vertebral artery is hypoplastic and terminates in PICA. Patent PICA, AICA, and SCA origins are identified bilaterally. The basilar artery is patent and congenitally small without significant focal stenosis. There are fetal origins of both PCAs without evidence of significant PCA stenosis. No aneurysm is identified. Venous sinuses: Patent. Anatomic variants: Fetal origins of both PCAs. Delayed phase: No abnormal enhancement identified with note made of incomplete imaging through the inferior portion of the posterior fossa. Review of the MIP images confirms the above findings CT Brain Perfusion Findings: CBF (<30%) Volume: 0 mL Perfusion (Tmax>6.0s) volume: 0 mL Mismatch Volume: 0 mL Infarction Location: None IMPRESSION: 1. No emergent large vessel occlusion. 2. Minimal atherosclerosis in the head and neck without major branch occlusion, significant stenosis, or aneurysm. 3. No infarct or penumbra identified on perfusion imaging. Preliminary  findings of no large vessel occlusion communicated in person to Dr. Rory Percy on 09/12/2018 at 9:30 a.m. Electronically Signed   By: Logan Bores M.D.   On: 09/12/2018 09:53   Ct Cerebral Perfusion W Contrast  Result Date: 09/12/2018 CLINICAL DATA:  Seizure. Not following commands. Rightward gaze. History of prostate cancer. EXAM: CT ANGIOGRAPHY HEAD AND NECK CT PERFUSION BRAIN TECHNIQUE:  Multidetector CT imaging of the head and neck was performed using the standard protocol during bolus administration of intravenous contrast. Multiplanar CT image reconstructions and MIPs were obtained to evaluate the vascular anatomy. Carotid stenosis measurements (when applicable) are obtained utilizing NASCET criteria, using the distal internal carotid diameter as the denominator. Multiphase CT imaging of the brain was performed following IV bolus contrast injection. Subsequent parametric perfusion maps were calculated using RAPID software. CONTRAST:  162mL ISOVUE-370 IOPAMIDOL (ISOVUE-370) INJECTION 76% COMPARISON:  None. FINDINGS: CTA NECK FINDINGS Aortic arch: Standard 3 vessel aortic arch with widely patent arch vessel origins. Right carotid system: Patent with mild calcified and soft plaque at the carotid bifurcation and in the distal cervical ICA. No evidence of dissection or stenosis. Left carotid system: Patent with minimal soft plaque at the carotid bifurcation. No evidence of dissection or stenosis. Vertebral arteries: Patent without evidence dissection or stenosis. Strongly dominant left vertebral artery. Skeleton: T2 superior endplate Schmorl's node. Degenerative endplate changes with small Schmorl's nodes at C6-7. Bilateral neural foraminal stenosis at C6-7 due to uncovertebral spurring. No suspicious osseous lesion. Other neck: No evidence of acute abnormality or mass. Upper chest: No apical lung consolidation or mass. Review of the MIP images confirms the above findings CTA HEAD FINDINGS Anterior circulation: The  internal carotid arteries are patent from skull base to carotid termini with mild nonstenotic atherosclerotic plaque noted on the left. ACAs and MCAs are patent without evidence of proximal branch occlusion or significant stenosis. No aneurysm is identified. Posterior circulation: The intracranial left vertebral artery is widely patent and supplies the basilar. The right vertebral artery is hypoplastic and terminates in PICA. Patent PICA, AICA, and SCA origins are identified bilaterally. The basilar artery is patent and congenitally small without significant focal stenosis. There are fetal origins of both PCAs without evidence of significant PCA stenosis. No aneurysm is identified. Venous sinuses: Patent. Anatomic variants: Fetal origins of both PCAs. Delayed phase: No abnormal enhancement identified with note made of incomplete imaging through the inferior portion of the posterior fossa. Review of the MIP images confirms the above findings CT Brain Perfusion Findings: CBF (<30%) Volume: 0 mL Perfusion (Tmax>6.0s) volume: 0 mL Mismatch Volume: 0 mL Infarction Location: None IMPRESSION: 1. No emergent large vessel occlusion. 2. Minimal atherosclerosis in the head and neck without major branch occlusion, significant stenosis, or aneurysm. 3. No infarct or penumbra identified on perfusion imaging. Preliminary findings of no large vessel occlusion communicated in person to Dr. Rory Percy on 09/12/2018 at 9:30 a.m. Electronically Signed   By: Logan Bores M.D.   On: 09/12/2018 09:53   Ct Head Code Stroke Wo Contrast  Result Date: 09/12/2018 CLINICAL DATA:  Code stroke.  Unable to follow commands EXAM: CT HEAD WITHOUT CONTRAST TECHNIQUE: Contiguous axial images were obtained from the base of the skull through the vertex without intravenous contrast. COMPARISON:  None. FINDINGS: Brain: No evidence of acute infarction, hemorrhage, hydrocephalus, extra-axial collection or mass lesion/mass effect. Vascular: No hyperdense  vessel or unexpected calcification. Skull: Normal. Negative for fracture or focal lesion. Sinuses/Orbits: Mild patchy mucosal thickening in the paranasal sinuses. Other: These results were communicated to Dr. Rory Percy at [place] 1/30/2020by text page via the [facility] messaging system. Not scored with this history IMPRESSION: Negative head CT. Electronically Signed   By: Monte Fantasia M.D.   On: 09/12/2018 09:19    Procedures .Critical Care Performed by: Lennice Sites, DO Authorized by: Lennice Sites, DO   Critical care provider statement:  Critical care time (minutes):  40   Critical care was necessary to treat or prevent imminent or life-threatening deterioration of the following conditions:  CNS failure or compromise   Critical care was time spent personally by me on the following activities:  Development of treatment plan with patient or surrogate, discussions with consultants, discussions with primary provider, evaluation of patient's response to treatment, examination of patient, obtaining history from patient or surrogate, ordering and review of laboratory studies, ordering and review of radiographic studies, pulse oximetry, re-evaluation of patient's condition, review of old charts and ordering and performing treatments and interventions   I assumed direction of critical care for this patient from another provider in my specialty: no     (including critical care time)  Medications Ordered in ED Medications  levETIRAcetam (KEPPRA) IVPB 1500 mg/ 100 mL premix (has no administration in time range)  levETIRAcetam (KEPPRA) tablet 500 mg (has no administration in time range)  iopamidol (ISOVUE-370) 76 % injection 100 mL (100 mLs Intravenous Contrast Given 09/12/18 0936)     Initial Impression / Assessment and Plan / ED Course  I have reviewed the triage vital signs and the nursing notes.  Pertinent labs & imaging results that were available during my care of the patient were reviewed by  me and considered in my medical decision making (see chart for details).     Peter Martinez is a 68 year old male with history of hypertension, diabetes, prostate cancer no longer on treatment who presents to the ED with strokelike symptoms, seizure-like activity.  Patient with unremarkable vitals.  No fever.  Patient arrives by EMS as a code stroke.  Last well-known was last night around 10:30 PM.  EMS states that wife noticed that patient was disoriented and kind of grumpy.  Patient with seizure-like activity with EMS.  Patient has right-sided neglect, right-sided field cut and some aphasia.  Unable to fully give history.  Patient was given 2 of Versed in route.  Concern for possible stroke versus seizure versus postictal state versus medicine side effect.  Patient went directly to the CT scan for further stroke work-up.  Patient with negative head CT.  TPA not given due to likely symptoms from seizure.  Given his prostate cancer history concern for possible mass versus primary seizure disorder.  No history of seizures.  Patient with no fever.  Improvement in mental status and exam fairly quickly while in the ED.  No concern for meningitis.  Patient was reevaluated and appears to be back at baseline.  Normal neurological exam.  Neurology recommending admission for MRI of his brain, EEG.  Lab work done showed no significant findings.  Patient had EKG that showed sinus rhythm.  Troponin within normal limits.  No significant kidney injury.  No significant leukocytosis.  Patient admitted to medicine service for further stroke work-up and seizure work-up.  Patient given Keppra.  Hemodynamically stable throughout my care.  This chart was dictated using voice recognition software.  Despite best efforts to proofread,  errors can occur which can change the documentation meaning.    Final Clinical Impressions(s) / ED Diagnoses   Final diagnoses:  Stroke-like symptoms  Seizure-like activity [facility])    ED  Discharge Orders    None       Lennice Sites, DO 09/12/18 1000    Lennice Sites, DO 09/12/18 1001

## 2018-09-13 ENCOUNTER — Inpatient Hospital Stay (HOSPITAL_COMMUNITY): Payer: BLUE CROSS/BLUE SHIELD

## 2018-09-13 DIAGNOSIS — I639 Cerebral infarction, unspecified: Secondary | ICD-10-CM

## 2018-09-13 LAB — GLUCOSE, CAPILLARY
Glucose-Capillary: 94 mg/dL (ref 70–99)
Glucose-Capillary: 78 mg/dL (ref 70–99)
Glucose-Capillary: 126 mg/dL — ABNORMAL HIGH (ref 70–99)

## 2018-09-13 LAB — LIPID PANEL
Cholesterol: 146 mg/dL (ref 0–200)
HDL: 55 mg/dL (ref >40)
LDL CALC: 77 mg/dL (ref 0–99)
Total CHOL/HDL Ratio: 2.7 RATIO
Triglycerides: 69 mg/dL (ref <150)
VLDL: 14 mg/dL (ref 0–40)

## 2018-09-13 LAB — ECHOCARDIOGRAM COMPLETE
Height: 76 in
Weight: 3587.33 oz

## 2018-09-13 LAB — HIV ANTIBODY (ROUTINE TESTING W REFLEX): HIV Screen 4th Generation wRfx: NONREACTIVE

## 2018-09-13 MED ORDER — CLOPIDOGREL BISULFATE 75 MG PO TABS: 75.0000 mg | ORAL_TABLET | Freq: Every day | ORAL | 0 refills | Status: DC

## 2018-09-13 MED ORDER — METHOTREXATE 2.5 MG PO TABS
20.0000 mg | ORAL_TABLET | ORAL | Status: DC
  Administered 2018-09-13: 20 mg via ORAL
  Filled 2018-09-13: qty 8

## 2018-09-13 MED ORDER — CLOPIDOGREL BISULFATE 75 MG PO TABS
75.0000 mg | ORAL_TABLET | Freq: Every day | ORAL | Status: DC
  Administered 2018-09-13: 75 mg via ORAL
  Filled 2018-09-13: qty 1

## 2018-09-13 MED ORDER — SIMVASTATIN 20 MG PO TABS: 20.0000 mg | ORAL_TABLET | Freq: Every day | ORAL | 30 refills | Status: AC

## 2018-09-13 MED ORDER — SIMVASTATIN 20 MG PO TABS: 20.0000 mg | ORAL_TABLET | Freq: Every day | ORAL | Status: DC

## 2018-09-13 MED ORDER — LEVETIRACETAM 500 MG PO TABS: 500.0000 mg | ORAL_TABLET | Freq: Two times a day (BID) | ORAL | 3 refills | Status: AC

## 2018-09-13 NOTE — Plan of Care (Signed)
  Problem: Education: Goal: Knowledge of General Education information will improve Description Including pain rating scale, medication(s)/side effects and non-pharmacologic comfort measures Outcome: Adequate for Discharge   Problem: Health Behavior/Discharge Planning: Goal: Ability to manage health-related needs will improve Outcome: Adequate for Discharge   Problem: Clinical Measurements: Goal: Ability to maintain clinical measurements within normal limits will improve Outcome: Adequate for Discharge Goal: Will remain free from infection Outcome: Adequate for Discharge Goal: Diagnostic test results will improve Outcome: Adequate for Discharge Goal: Cardiovascular complication will be avoided Outcome: Adequate for Discharge   Problem: Activity: Goal: Risk for activity intolerance will decrease Outcome: Adequate for Discharge   Problem: Nutrition: Goal: Adequate nutrition will be maintained Outcome: Adequate for Discharge   Problem: Coping: Goal: Level of anxiety will decrease Outcome: Adequate for Discharge   Problem: Elimination: Goal: Will not experience complications related to bowel motility Outcome: Adequate for Discharge Goal: Will not experience complications related to urinary retention Outcome: Adequate for Discharge   Problem: Pain Managment: Goal: General experience of comfort will improve Outcome: Adequate for Discharge   Problem: Safety: Goal: Ability to remain free from injury will improve Outcome: Adequate for Discharge   Problem: Skin Integrity: Goal: Risk for impaired skin integrity will decrease Outcome: Adequate for Discharge   Problem: Education: Goal: Expressions of having a comfortable level of knowledge regarding the disease process will increase Outcome: Adequate for Discharge   Problem: Coping: Goal: Ability to adjust to condition or change in health will improve Outcome: Adequate for Discharge Goal: Ability to identify appropriate  support needs will improve Outcome: Adequate for Discharge   Problem: Health Behavior/Discharge Planning: Goal: Compliance with prescribed medication regimen will improve Outcome: Adequate for Discharge   Problem: Medication: Goal: Risk for medication side effects will decrease Outcome: Adequate for Discharge   Problem: Clinical Measurements: Goal: Complications related to the disease process, condition or treatment will be avoided or minimized Outcome: Adequate for Discharge Goal: Diagnostic test results will improve Outcome: Adequate for Discharge   Problem: Safety: Goal: Verbalization of understanding the information provided will improve Outcome: Adequate for Discharge   Problem: Self-Concept: Goal: Level of anxiety will decrease Outcome: Adequate for Discharge Goal: Ability to verbalize feelings about condition will improve Outcome: Adequate for Discharge

## 2018-09-13 NOTE — Progress Notes (Addendum)
STROKE TEAM PROGRESS NOTE   INTERVAL HISTORY His wife and daughter is at the bedside.  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 He is under financial stress and is worried about taxes. He is upset he cannot drive. We discussed the law.  Vitals:   09/12/18 2341 09/13/18 0342 09/13/18 0833 09/13/18 1155  BP: 117/74 101/78 131/85 118/81  Pulse: (!) 56 (!) 58 60 98  Resp: 18 20 17 19   Temp: 97.7 F (36.5 C) 97.9 F (36.6 C) 98.7 F (37.1 C) 98.2 F (36.8 C)  TempSrc: Oral Oral Oral Oral  SpO2: 98% 96% 97% 98%  Weight:      Height:        CBC:  Recent Labs  Lab 09/12/18 0905  WBC 5.4  NEUTROABS 3.8  HGB 13.9  HCT 44.2  MCV 96.9  PLT 503    Basic Metabolic Panel:  Recent Labs  Lab 09/12/18 0905 09/12/18 0911  NA 138  --   K 4.1  --   CL 106  --   CO2 24  --   GLUCOSE 126*  --   BUN 14  --   CREATININE 1.18 1.20  CALCIUM 9.0  --    Lipid Panel:     Component Value Date/Time   CHOL 146 09/13/2018 0626   TRIG 69 09/13/2018 0626   HDL 55 09/13/2018 0626   CHOLHDL 2.7 09/13/2018 0626   VLDL 14 09/13/2018 0626   LDLCALC 77 09/13/2018 0626   HgbA1c:  Lab Results  Component Value Date   HGBA1C 6.1 (H) 09/12/2018   Urine Drug Screen:     Component Value Date/Time   LABOPIA NONE DETECTED 09/12/2018 0905   COCAINSCRNUR NONE DETECTED 09/12/2018 0905   LABBENZ POSITIVE (A) 09/12/2018 0905   AMPHETMU NONE DETECTED 09/12/2018 0905   THCU NONE DETECTED 09/12/2018 0905   LABBARB NONE DETECTED 09/12/2018 0905    Alcohol Level     Component Value Date/Time   ETH <10 09/12/2018 0905    IMAGING Ct Angio Head W Or Wo Contrast  Result Date: 09/12/2018 CLINICAL DATA:  Seizure. Not following commands. Rightward gaze. History of prostate cancer. EXAM: CT ANGIOGRAPHY HEAD AND NECK CT PERFUSION BRAIN TECHNIQUE: Multidetector CT imaging of the head and neck was performed using the standard protocol during bolus administration of  intravenous contrast. Multiplanar CT image reconstructions and MIPs were obtained to evaluate the vascular anatomy. Carotid stenosis measurements (when applicable) are obtained utilizing NASCET criteria, using the distal internal carotid diameter as the denominator. Multiphase CT imaging of the brain was performed following IV bolus contrast injection. Subsequent parametric perfusion maps were calculated using RAPID software. CONTRAST:  116mL ISOVUE-370 IOPAMIDOL (ISOVUE-370) INJECTION 76% COMPARISON:  None. FINDINGS: CTA NECK FINDINGS Aortic arch: Standard 3 vessel aortic arch with widely patent arch vessel origins. Right carotid system: Patent with mild calcified and soft plaque at the carotid bifurcation and in the distal cervical ICA. No evidence of dissection or stenosis. Left carotid system: Patent with minimal soft plaque at the carotid bifurcation. No evidence of dissection or stenosis. Vertebral arteries: Patent without evidence dissection or stenosis. Strongly dominant left vertebral artery. Skeleton: T2 superior endplate Schmorl's node. Degenerative endplate changes with small Schmorl's nodes at C6-7. Bilateral neural foraminal stenosis at C6-7 due to uncovertebral spurring. No suspicious osseous lesion. Other neck: No evidence of acute abnormality or mass. Upper chest: No apical lung consolidation or mass. Review of  the MIP images confirms the above findings CTA HEAD FINDINGS Anterior circulation: The internal carotid arteries are patent from skull base to carotid termini with mild nonstenotic atherosclerotic plaque noted on the left. ACAs and MCAs are patent without evidence of proximal branch occlusion or significant stenosis. No aneurysm is identified. Posterior circulation: The intracranial left vertebral artery is widely patent and supplies the basilar. The right vertebral artery is hypoplastic and terminates in PICA. Patent PICA, AICA, and SCA origins are identified bilaterally. The basilar artery  is patent and congenitally small without significant focal stenosis. There are fetal origins of both PCAs without evidence of significant PCA stenosis. No aneurysm is identified. Venous sinuses: Patent. Anatomic variants: Fetal origins of both PCAs. Delayed phase: No abnormal enhancement identified with note made of incomplete imaging through the inferior portion of the posterior fossa. Review of the MIP images confirms the above findings CT Brain Perfusion Findings: CBF (<30%) Volume: 0 mL Perfusion (Tmax>6.0s) volume: 0 mL Mismatch Volume: 0 mL Infarction Location: None IMPRESSION: 1. No emergent large vessel occlusion. 2. Minimal atherosclerosis in the head and neck without major branch occlusion, significant stenosis, or aneurysm. 3. No infarct or penumbra identified on perfusion imaging. Preliminary findings of no large vessel occlusion communicated in person to Dr. Rory Percy on 09/12/2018 at 9:30 a.m. Electronically Signed   By: Logan Bores M.D.   On: 09/12/2018 09:53   Ct Angio Neck W Or Wo Contrast  Result Date: 09/12/2018 CLINICAL DATA:  Seizure. Not following commands. Rightward gaze. History of prostate cancer. EXAM: CT ANGIOGRAPHY HEAD AND NECK CT PERFUSION BRAIN TECHNIQUE: Multidetector CT imaging of the head and neck was performed using the standard protocol during bolus administration of intravenous contrast. Multiplanar CT image reconstructions and MIPs were obtained to evaluate the vascular anatomy. Carotid stenosis measurements (when applicable) are obtained utilizing NASCET criteria, using the distal internal carotid diameter as the denominator. Multiphase CT imaging of the brain was performed following IV bolus contrast injection. Subsequent parametric perfusion maps were calculated using RAPID software. CONTRAST:  118mL ISOVUE-370 IOPAMIDOL (ISOVUE-370) INJECTION 76% COMPARISON:  None. FINDINGS: CTA NECK FINDINGS Aortic arch: Standard 3 vessel aortic arch with widely patent arch vessel origins.  Right carotid system: Patent with mild calcified and soft plaque at the carotid bifurcation and in the distal cervical ICA. No evidence of dissection or stenosis. Left carotid system: Patent with minimal soft plaque at the carotid bifurcation. No evidence of dissection or stenosis. Vertebral arteries: Patent without evidence dissection or stenosis. Strongly dominant left vertebral artery. Skeleton: T2 superior endplate Schmorl's node. Degenerative endplate changes with small Schmorl's nodes at C6-7. Bilateral neural foraminal stenosis at C6-7 due to uncovertebral spurring. No suspicious osseous lesion. Other neck: No evidence of acute abnormality or mass. Upper chest: No apical lung consolidation or mass. Review of the MIP images confirms the above findings CTA HEAD FINDINGS Anterior circulation: The internal carotid arteries are patent from skull base to carotid termini with mild nonstenotic atherosclerotic plaque noted on the left. ACAs and MCAs are patent without evidence of proximal branch occlusion or significant stenosis. No aneurysm is identified. Posterior circulation: The intracranial left vertebral artery is widely patent and supplies the basilar. The right vertebral artery is hypoplastic and terminates in PICA. Patent PICA, AICA, and SCA origins are identified bilaterally. The basilar artery is patent and congenitally small without significant focal stenosis. There are fetal origins of both PCAs without evidence of significant PCA stenosis. No aneurysm is identified. Venous sinuses: Patent. Anatomic variants:  Fetal origins of both PCAs. Delayed phase: No abnormal enhancement identified with note made of incomplete imaging through the inferior portion of the posterior fossa. Review of the MIP images confirms the above findings CT Brain Perfusion Findings: CBF (<30%) Volume: 0 mL Perfusion (Tmax>6.0s) volume: 0 mL Mismatch Volume: 0 mL Infarction Location: None IMPRESSION: 1. No emergent large vessel  occlusion. 2. Minimal atherosclerosis in the head and neck without major branch occlusion, significant stenosis, or aneurysm. 3. No infarct or penumbra identified on perfusion imaging. Preliminary findings of no large vessel occlusion communicated in person to Dr. Rory Percy on 09/12/2018 at 9:30 a.m. Electronically Signed   By: Logan Bores M.D.   On: 09/12/2018 09:53   Mr Jeri Cos HQ Contrast  Result Date: 09/12/2018 CLINICAL DATA:  Seizure. Right sided neglect. Right-sided field cut, aphasia EXAM: MRI HEAD WITHOUT AND WITH CONTRAST TECHNIQUE: Multiplanar, multiecho pulse sequences of the brain and surrounding structures were obtained without and with intravenous contrast. CONTRAST:  10 mL Gadovist IV COMPARISON:  CTA and CT perfusion 09/12/2018 FINDINGS: Brain: Patchy restricted diffusion in the posterior left parietal lobe compatible with acute infarct in the left MCA territory. No other acute infarct. Negative for hemorrhage or mass. Ventricle size and cerebral volume normal. Patchy white matter hyperintensity bilaterally appears chronic. Brainstem normal. Negative for hemorrhage or mass. Normal enhancement postcontrast administration. Vascular: Normal arterial flow voids Skull and upper cervical spine: Negative Sinuses/Orbits: Mild mucosal edema paranasal sinuses. Normal orbit Other: None IMPRESSION: Small, patchy acute infarct in the left posterior parietal lobe Mild chronic microvascular ischemic changes in the white matter. Electronically Signed   By: Franchot Gallo M.D.   On: 09/12/2018 15:40   Ct Cerebral Perfusion W Contrast  Result Date: 09/12/2018 CLINICAL DATA:  Seizure. Not following commands. Rightward gaze. History of prostate cancer. EXAM: CT ANGIOGRAPHY HEAD AND NECK CT PERFUSION BRAIN TECHNIQUE: Multidetector CT imaging of the head and neck was performed using the standard protocol during bolus administration of intravenous contrast. Multiplanar CT image reconstructions and MIPs were obtained  to evaluate the vascular anatomy. Carotid stenosis measurements (when applicable) are obtained utilizing NASCET criteria, using the distal internal carotid diameter as the denominator. Multiphase CT imaging of the brain was performed following IV bolus contrast injection. Subsequent parametric perfusion maps were calculated using RAPID software. CONTRAST:  171mL ISOVUE-370 IOPAMIDOL (ISOVUE-370) INJECTION 76% COMPARISON:  None. FINDINGS: CTA NECK FINDINGS Aortic arch: Standard 3 vessel aortic arch with widely patent arch vessel origins. Right carotid system: Patent with mild calcified and soft plaque at the carotid bifurcation and in the distal cervical ICA. No evidence of dissection or stenosis. Left carotid system: Patent with minimal soft plaque at the carotid bifurcation. No evidence of dissection or stenosis. Vertebral arteries: Patent without evidence dissection or stenosis. Strongly dominant left vertebral artery. Skeleton: T2 superior endplate Schmorl's node. Degenerative endplate changes with small Schmorl's nodes at C6-7. Bilateral neural foraminal stenosis at C6-7 due to uncovertebral spurring. No suspicious osseous lesion. Other neck: No evidence of acute abnormality or mass. Upper chest: No apical lung consolidation or mass. Review of the MIP images confirms the above findings CTA HEAD FINDINGS Anterior circulation: The internal carotid arteries are patent from skull base to carotid termini with mild nonstenotic atherosclerotic plaque noted on the left. ACAs and MCAs are patent without evidence of proximal branch occlusion or significant stenosis. No aneurysm is identified. Posterior circulation: The intracranial left vertebral artery is widely patent and supplies the basilar. The right vertebral artery is  hypoplastic and terminates in PICA. Patent PICA, AICA, and SCA origins are identified bilaterally. The basilar artery is patent and congenitally small without significant focal stenosis. There are  fetal origins of both PCAs without evidence of significant PCA stenosis. No aneurysm is identified. Venous sinuses: Patent. Anatomic variants: Fetal origins of both PCAs. Delayed phase: No abnormal enhancement identified with note made of incomplete imaging through the inferior portion of the posterior fossa. Review of the MIP images confirms the above findings CT Brain Perfusion Findings: CBF (<30%) Volume: 0 mL Perfusion (Tmax>6.0s) volume: 0 mL Mismatch Volume: 0 mL Infarction Location: None IMPRESSION: 1. No emergent large vessel occlusion. 2. Minimal atherosclerosis in the head and neck without major branch occlusion, significant stenosis, or aneurysm. 3. No infarct or penumbra identified on perfusion imaging. Preliminary findings of no large vessel occlusion communicated in person to Dr. Rory Percy on 09/12/2018 at 9:30 a.m. Electronically Signed   By: Logan Bores M.D.   On: 09/12/2018 09:53   Ct Head Code Stroke Wo Contrast  Result Date: 09/12/2018 CLINICAL DATA:  Code stroke.  Unable to follow commands EXAM: CT HEAD WITHOUT CONTRAST TECHNIQUE: Contiguous axial images were obtained from the base of the skull through the vertex without intravenous contrast. COMPARISON:  None. FINDINGS: Brain: No evidence of acute infarction, hemorrhage, hydrocephalus, extra-axial collection or mass lesion/mass effect. Vascular: No hyperdense vessel or unexpected calcification. Skull: Normal. Negative for fracture or focal lesion. Sinuses/Orbits: Mild patchy mucosal thickening in the paranasal sinuses. Other: These results were communicated to Dr. Rory Percy at [facility] 1/30/2020by text page via the [facility] messaging system. Not scored with this history IMPRESSION: Negative head CT. Electronically Signed   By: Monte Fantasia M.D.   On: 09/12/2018 09:19    PHYSICAL EXAM General: Awake alert in no distress HEENT: Normocephalic atraumatic  Respiratory: unlabored respirations Cardiovascular: S1-heard regular rate  rhythm Extremities warm well perfused  Neurological exam Patient was awake alert oriented to person, place, time No dysarthria Able to follow commands without difficulty. Cranial nerves: Pupils equal round reactive light, no gaze preference, able to count fingers all fields. face is symmetric, palate midline, tongue midline. Motor exam: No drift in any of the extremities and 5/5 strength all over.No orbiting.  Sensory exam: Intact all over Coordination: intact bilaterally.  Gait testing was deferred at this time   ASSESSMENT/PLAN [name] is a 68 y.o. male with history of diabetes, prostate cancer, hypertension presenting with seizure.   Stroke:   Patchy L posterior parietal infarcts embolic secondary to unknown source   Code Stroke CT head negative   CTA head & neck no ELVO. Minimal atherosclerosis.  CT perfusion no penumbra  MRI w/ & w/o  Small patchy infarcts L posterior parietal lobe. Mild microvascular ischemic changes white matter. No mets.  2D Echo  pending   Needs TEE to look for embolic source. could not get on schedule today. Given significant improvement in neuro status, feel he can be discharged and do TEE and loop as an OP. Discussed with cardiology who will make arrangements for EP consult for loop recorder to look for AF and TEE, so loop can be placed at time of OP TEE if it is negative. Office will call pt with appt date and time.  LDL 77  HgbA1c 6.1  ANA pending   Lovenox 40 mg sq daily for VTE prophylaxis  aspirin 81 mg daily prior to admission, now on aspirin 81 mg daily. Given mild stroke, recommend aspirin 81  mg and plavix 75 mg daily x 3 weeks, then PLAVIX and no aspirin Orders adjusted.   Therapy recommendations:  No therapy needs per pt (he has worked with PT. Awaiting note)  Disposition:  pending   Discussed plan with DR. Makensey Rego and Dr. Loleta Books  Seizure  Secondary to acute stroke  Grand mal seizure lasting 1-2 mins during EMS  transport  Seizures stopped w/ modazolam treatment  EEG generalized slowing.  No evidence of status epilepticus.  On Keppra 500 bid following load. Continue at d/c  According to [place] law, pt can not drive until seizure free for 6 months and under physician's care. This was discussed at length with pt, wife and dtr.  Hypertension  Stable . Permissive hypertension (OK if < 220/120) but gradually normalize in 5-7 days . Long-term BP goal normotensive  Hyperlipidemia  Home meds:  zocor 10  Changed to lipitor 80 on admission  LDL 77, goal < 70  Will change statin to zocor 20  Continue statin at discharge  Diabetes type II  HgbA1c 6.1, goal < 7.0  Controlled  Other Stroke Risk Factors  Advanced age  Other Active Problems  Prostate cancer, MRI w/ contrast shows no mets  Connective tissue disease on plaquenil.  Hospital day # 1  Burnetta Sabin, MSN, APRN, ANVP-BC, AGPCNP-BC Advanced Practice Stroke Nurse [facility] for Schedule & Pager information 09/13/2018 12:18 PM   ATTENDING NOTE: I reviewed above note and agree with the assessment and plan. Pt was seen and examined.   68 year old male with history of positive cancer status post radiation, diabetes, hypertension admitted for seizure episode x2 with right gaze, right neglect, right hemianopia and aphasia.  Symptom resolved.  EEG no seizure.  CT no acute abnormality.  CTA head and neck unremarkable except hypoplastic bilaterally with bilateral fetal PCAs.  MRI showed left posterior parietal scattered punctate infarcts, embolic pattern.  EF 55 to 60%.  A1c 6.1 and LDL 77.  TSH within normal range.  He has history of mixed connective tissue disease, currently on Plaquenil.  ANA pending  On exam, patient neurologically intact, AAO x3, no focal deficit.  His seizure most likely related to his left MCA stroke.  He is on Keppra 500 twice daily, no recurrent seizure at this time.  Given embolic pattern,  recommend TEE and loop recorder as outpatient.  Refer to cardiology to arrange.  Recommend aspirin 81 and Plavix 75 for 3 weeks and then Plavix alone.  Increase Zocor from 10 to 20 for hyperlipidemia and stroke prevention.  Continue Keppra for seizure control.  Patient was educated on no driving until seizure-free for 6 months.  Patient is a driver for [employer].  He is counseled on seeking for short-term disability and make job duty changes during the interval time.  Neurology will sign off. Please call with questions. Pt will follow up with stroke clinic NP at [facility] in about 4 weeks. Thanks for the consult.   Rosalin Hawking, MD PhD Stroke Neurology 09/13/2018 9:30 PM   To contact Stroke Continuity provider, please refer to [email or web address] After hours, contact General Neurology

## 2018-09-13 NOTE — Progress Notes (Signed)
  Echocardiogram 2D Echocardiogram has been performed.  Jannett Celestine 09/13/2018, 9:22 AM

## 2018-09-13 NOTE — Evaluation (Signed)
Occupational Therapy Evaluation Patient Details Name: Peter Martinez MRN: [id number] DOB: March 23, 1951 Today's Date: 09/13/2018    History of Present Illness 68 y.o. male with medical history significant of HTN; DM; and prostate CA presenting as Code Stroke.  He reports confusion and R arm weakness. MRI shows small patchy acute infarct in the left posterior parietal lobe   Clinical Impression   Pt admitted with the above diagnoses and presents with below problem list. Pt will benefit from continued acute OT to address the below listed deficits and maximize independence with basic ADLs prior to d/c home. PTA pt was independent with ADLs/IADLs, works in [employer] where driving is a significant part of his job responsibility. Pt is currently setup to supervision level with ADLs. Pt reports worrying about finances as driving restrictions will impact his livelihood. Discussed looking into any employee benefits for possibly going on disability in the short-term. Also left message with case manager inquiring about possible resources.      Follow Up Recommendations  No OT follow up    Equipment Recommendations  None recommended by OT    Recommendations for Other Services       Precautions / Restrictions Precautions Precautions: Other (comment) Precaution Comments: seizure Restrictions Weight Bearing Restrictions: No      Mobility Bed Mobility Overal bed mobility: Independent                Transfers Overall transfer level: Needs assistance Equipment used: None Transfers: Sit to/from Stand Sit to Stand: Independent              Balance Overall balance assessment: Needs assistance;Independent   Sitting balance-Leahy Scale: Normal       Standing balance-Leahy Scale: Normal                   Standardized Balance Assessment Standardized Balance Assessment : Dynamic Gait Index   Dynamic Gait Index Level Surface: Normal Change in Gait Speed: Normal Gait with  Horizontal Head Turns: Mild Impairment(veers with R head turns) Gait with Vertical Head Turns: Normal Gait and Pivot Turn: Moderate Impairment Step Over Obstacle: Normal Step Around Obstacles: Normal Steps: Normal Total Score: 21     ADL either performed or assessed with clinical judgement   ADL Overall ADL's : Needs assistance/impaired Eating/Feeding: Set up;Sitting   Grooming: Supervision/safety;Standing   Upper Body Bathing: Set up;Sitting   Lower Body Bathing: Supervison/ safety;Sit to/from stand   Upper Body Dressing : Set up;Sitting   Lower Body Dressing: Supervision/safety   Toilet Transfer: Supervision/safety   Toileting- Clothing Manipulation and Hygiene: Supervision/safety   Tub/ Shower Transfer: Supervision/safety   Functional mobility during ADLs: Supervision/safety       Vision Baseline Vision/History: Wears glasses       Perception     Praxis      Pertinent Vitals/Pain Pain Assessment: No/denies pain     Hand Dominance Right   Extremity/Trunk Assessment Upper Extremity Assessment Upper Extremity Assessment: Defer to OT evaluation   Lower Extremity Assessment Lower Extremity Assessment: Overall WFL for tasks assessed(intact coordination, sensation and strength)       Communication Communication Communication: No difficulties   Cognition Arousal/Alertness: Awake/alert Behavior During Therapy: Flat affect Overall Cognitive Status: Within Functional Limits for tasks assessed                                     General Comments  patient feels issues  related to being in bed too much; educated if at home not quite normal still needs to practice balance and coordination and demonstrated crossing over and braiding near counter as well as work on single limb stance.  Also discussed gradual return to activities    Exercises     Shoulder Instructions      Home Living Family/patient expects to be discharged to:: Private  residence Living Arrangements: Spouse/significant other Available Help at Discharge: Family Type of Home: House Home Access: [place]: Two level;Able to live on main level with bedroom/bathroom Alternate Level Stairs-Number of Steps: 13 Alternate Level Stairs-Rails: Left Bathroom Shower/Tub: Occupational psychologist: Standard     Home Equipment: None          Prior Functioning/Environment Level of Independence: Independent        Comments: works in [employer], driving is a major job responsibility        OT Problem List: Decreased knowledge of use of DME or AE;Decreased knowledge of precautions;Decreased activity tolerance;Cardiopulmonary status limiting activity      OT Treatment/Interventions: Self-care/ADL training;DME and/or AE instruction;Therapeutic activities;Patient/family education;Balance training    OT Goals(Current goals can be found in the care plan section) Acute Rehab OT Goals Patient Stated Goal: be able to return to work OT Goal Formulation: With patient Time For Goal Achievement: 09/20/18 Potential to Achieve Goals: Good ADL Goals Pt Will Perform Grooming: Independently;standing Pt Will Perform Tub/Shower Transfer: Independently  OT Frequency: Min 2X/week   Barriers to D/C:            Co-evaluation              AM-PAC OT "6 Clicks" Daily Activity     Outcome Measure Help from another person eating meals?: None Help from another person taking care of personal grooming?: None Help from another person toileting, which includes using toliet, bedpan, or urinal?: None Help from another person bathing (including washing, rinsing, drying)?: None Help from another person to put on and taking off regular upper body clothing?: None Help from another person to put on and taking off regular lower body clothing?: None 6 Click Score: 24   End of Session    Activity Tolerance: Patient tolerated treatment  well Patient left: in bed;with call bell/phone within reach;with family/visitor present  OT Visit Diagnosis: Unsteadiness on feet (R26.81)                Time: 6286-3817 OT Time Calculation (min): 15 min Charges:  OT General Charges $OT Visit: 1 Visit OT Evaluation $OT Eval Low Complexity: [place], OT Acute Rehabilitation Services Pager: [phone] Office: [phone]   Hortencia Pilar 09/13/2018, 12:50 PM

## 2018-09-13 NOTE — Progress Notes (Signed)
Ok to resume home methotrexate per Dr. Loleta Books.   Methotrexate 20mg  QFriday  Onnie Boer, PharmD, [place], AAHIVP, CPP Infectious Disease Pharmacist 09/13/2018 2:34 PM

## 2018-09-13 NOTE — Care Management Note (Signed)
Case Management Note  Patient Details  Name: NYEEM STOKE MRN: [id number] Date of Birth: April 21, 1951  Subjective/Objective:    Pt admitted with CVA. He is from home with spouse.  DME: none No issues obtaining meds.  Denies transportation issues.                Action/Plan: No f/u per PT/OT and no DME needs. Pt has transportation home when medically ready.   Expected Discharge Date:                  Expected Discharge Plan:  Home/Self Care  In-House Referral:     Discharge planning Services     Post Acute Care Choice:    Choice offered to:     DME Arranged:    DME Agency:     HH Arranged:    [place] Agency:     Status of Service:  Completed, signed off  If discussed at [employer] of Stay Meetings, dates discussed:    Additional Comments:  Pollie Friar, RN 09/13/2018, 4:34 PM

## 2018-09-13 NOTE — Discharge Summary (Signed)
Physician Discharge Summary  Peter Martinez WUX:[id number] DOB: 1951/04/24 DOA: 09/12/2018  PCP: Asencion Noble, MD  Admit date: 09/12/2018 Discharge date: 09/13/2018  Admitted From: Home  Disposition:  Home   Recommendations for Outpatient Follow-up:  1. Follow up with PCP in 1-2 weeks 2. Please obtain BMP/CBC in one week and lipids in 3 months 3. Follow up with Neurology in 4-6 weeks 4. Dr. Willey Blade: please ensure patient has had Cardiology follow up for TEE and loop recorder 5. Baby aspirin and Plavix for 3 weeks, followed by Plavix alone indefinitely; simvastatin increased      Home Health: None  Equipment/Devices: None  Discharge Condition: Good  CODE STATUS: FULL Diet recommendation: Diabetic, cardiac  Brief/Interim Summary: Peter Martinez is a 68 yo M with HTN, DM, and psoriatic arthritis on methotrexate who presents with acute confusion, inattentiveness.  Patient had an episode while driving the other day of transient right arm weakness and numbness, lasting minutes. Then, day of admission, patient woke for work and was disoriented, confused.  When he got to work, he was so confused and inattentive that his co-workers Pharmacist, community.  En route, he had a grand mal seizure (unresponsive, staring to side, tonic movements of arms, broken with midazolam).       PRINCIPAL HOSPITAL DIAGNOSIS: Acute embolic stroke    Discharge Diagnoses:   Acute left parietal lobe embolic stroke -Non-invasive angiography showed mild atherosclerosis -Echocardiogram showed no cardiogenic source of embolism -Carotid imaging unremarkable   -Lipids ordered: LDL 77 and simvastatin increased -Aspirin ordered at admission --> discharged on aspirin and Plavix for 3 weeks then, Plavix indefinitely -Atrial fibrillation: Not noted on telemetry overnight -tPA not given because stroke symptoms too mild -Dysphagia screen ordered in ER -PT eval ordered: recommended no therapy follow up -Smoking cessation:  non-smoker   Post-stroke seizure Grand mal seizure noted in EMS.  Aborted with Versed.  Loaded with Keppra in hospital.  No further seizures noted.  Driving restrictions reviewed.  Discharged on Keppra BID 500 and Neurology follow up.  Diabetes Glucoses well controlled.  A1c 6.1%  Hypertension  Psoriatic arthritis Methotrexate continued.  ANA pending.            Discharge Instructions  Discharge Instructions    Ambulatory referral to Neurology   Complete by:  As directed    Follow up with stroke clinic NP (Jessica Vanschaick or Cecille Rubin, if both not available, consider Dr. Antony Contras, Dr. Bess Harvest, or Dr. Sarina Ill) at [facility] in about 4 weeks.   Diet - low sodium heart healthy   Complete by:  As directed    Discharge instructions   Complete by:  As directed    From Dr. Loleta Martinez: You were admitted with confusion and a seizure.   This seizure was caused by a stroke. The stroke was likely caused by an "embolism", which means a piece of debris from elsewhere in the heart.   For the stroke: Increase your cholesterol medicine to simvastatin 20 mg daily (new prescription was sent in for you) Start taking the new medicine clopidogrel/Plavix 75 mg daily (Plavix is a super aspirin) Continue your baby aspirin 81 mg for three more weeks then stop (and continue the Plavix only)   To figure out where the embolism came from, the Cardiology office will call you with instructions.   They will reach you by phone in the next few days, their contact number is listed on this sheet. If you haven't heard from them  by the middle of next week, contact your primary care doctor for a referral.    WIth regard to the seizure: No driving for 6 months Start levetiracetam/Keppra 500 mg twice daily (prescription sent in) Follow up with [facility] in 4-6 weeks, ask them if you need to continue the [place] your other home  medicines Call your primary care doctor for a follow up appointment in 1-2 weeks Do not drink alcohol, as this provokes Afib and it also provokes seizures.   Increase activity slowly   Complete by:  As directed      Allergies as of 09/13/2018   No Known Allergies     Medication List    TAKE these medications   acetaminophen 500 MG tablet Commonly known as:  TYLENOL Take 1,000 mg by mouth as needed.   allopurinol 300 MG tablet Commonly known as:  ZYLOPRIM Take 300 mg by mouth daily.   aspirin EC 81 MG tablet Take 81 mg by mouth daily.   B-12 2500 MCG Tabs Take 2,500 mcg by mouth daily.   cholecalciferol 1000 units tablet Commonly known as:  VITAMIN D Take 5,000 Units by mouth at bedtime.   clopidogrel 75 MG tablet Commonly known as:  PLAVIX Take 1 tablet (75 mg total) by mouth daily. Start taking on:  September 14, 2018   colchicine 0.6 MG tablet One tab po q 2 hrs until pain relief or stomach upset What changed:    how much to take  how to take this  when to take this  reasons to take this   folic acid 1 MG tablet Commonly known as:  FOLVITE Take 1 mg by mouth daily.   hydroxychloroquine 200 MG tablet Commonly known as:  PLAQUENIL Take 200 mg by mouth 2 (two) times daily.   levETIRAcetam 500 MG tablet Commonly known as:  KEPPRA Take 1 tablet (500 mg total) by mouth 2 (two) times daily.   losartan 100 MG tablet Commonly known as:  COZAAR Take 100 mg by mouth at bedtime.   metFORMIN 1000 MG tablet Commonly known as:  GLUCOPHAGE Take 1,000 mg by mouth 2 (two) times daily with a meal.   methotrexate 2.5 MG tablet Commonly known as:  RHEUMATREX Take 20 mg by mouth every Friday. On Fridays  8 tablets   metoprolol tartrate 50 MG tablet Commonly known as:  LOPRESSOR Take 50 mg by mouth 2 (two) times daily.   simvastatin 20 MG tablet Commonly known as:  ZOCOR Take 1 tablet (20 mg total) by mouth at bedtime. What changed:    medication  strength  how much to take      Follow-up Information    [facility] Follow up in 4 week(s).   Specialty:  Neurology Why:  stroke clinic. office will call with appt date and time. Contact information: [place] [phone]       [facility] Follow up.   Specialty:  Cardiology Why:  office will call you with appt date and time - for evaluation with TEE and for loop recorder placment to look for atrial fibrillation as source of stroke if TEE negative Contact information: [address], [place] [phone]         No Known Allergies  Consultations:  Neurology   Procedures/Studies: Ct Angio Head W Or Wo Contrast  Result Date: 09/12/2018 CLINICAL DATA:  Seizure. Not following  commands. Rightward gaze. History of prostate cancer. EXAM: CT ANGIOGRAPHY HEAD AND NECK CT PERFUSION BRAIN TECHNIQUE: Multidetector CT imaging of the head and neck was performed using the standard protocol during bolus administration of intravenous contrast. Multiplanar CT image reconstructions and MIPs were obtained to evaluate the vascular anatomy. Carotid stenosis measurements (when applicable) are obtained utilizing NASCET criteria, using the distal internal carotid diameter as the denominator. Multiphase CT imaging of the brain was performed following IV bolus contrast injection. Subsequent parametric perfusion maps were calculated using RAPID software. CONTRAST:  123mL ISOVUE-370 IOPAMIDOL (ISOVUE-370) INJECTION 76% COMPARISON:  None. FINDINGS: CTA NECK FINDINGS Aortic arch: Standard 3 vessel aortic arch with widely patent arch vessel origins. Right carotid system: Patent with mild calcified and soft plaque at the carotid bifurcation and in the distal cervical ICA. No evidence of dissection or stenosis. Left carotid system: Patent with minimal soft plaque at the carotid  bifurcation. No evidence of dissection or stenosis. Vertebral arteries: Patent without evidence dissection or stenosis. Strongly dominant left vertebral artery. Skeleton: T2 superior endplate Schmorl's node. Degenerative endplate changes with small Schmorl's nodes at C6-7. Bilateral neural foraminal stenosis at C6-7 due to uncovertebral spurring. No suspicious osseous lesion. Other neck: No evidence of acute abnormality or mass. Upper chest: No apical lung consolidation or mass. Review of the MIP images confirms the above findings CTA HEAD FINDINGS Anterior circulation: The internal carotid arteries are patent from skull base to carotid termini with mild nonstenotic atherosclerotic plaque noted on the left. ACAs and MCAs are patent without evidence of proximal branch occlusion or significant stenosis. No aneurysm is identified. Posterior circulation: The intracranial left vertebral artery is widely patent and supplies the basilar. The right vertebral artery is hypoplastic and terminates in PICA. Patent PICA, AICA, and SCA origins are identified bilaterally. The basilar artery is patent and congenitally small without significant focal stenosis. There are fetal origins of both PCAs without evidence of significant PCA stenosis. No aneurysm is identified. Venous sinuses: Patent. Anatomic variants: Fetal origins of both PCAs. Delayed phase: No abnormal enhancement identified with note made of incomplete imaging through the inferior portion of the posterior fossa. Review of the MIP images confirms the above findings CT Brain Perfusion Findings: CBF (<30%) Volume: 0 mL Perfusion (Tmax>6.0s) volume: 0 mL Mismatch Volume: 0 mL Infarction Location: None IMPRESSION: 1. No emergent large vessel occlusion. 2. Minimal atherosclerosis in the head and neck without major branch occlusion, significant stenosis, or aneurysm. 3. No infarct or penumbra identified on perfusion imaging. Preliminary findings of no large vessel occlusion  communicated in person to Dr. Rory Percy on 09/12/2018 at 9:30 a.m. Electronically Signed   By: Logan Bores M.D.   On: 09/12/2018 09:53   Ct Angio Neck W Or Wo Contrast  Result Date: 09/12/2018 CLINICAL DATA:  Seizure. Not following commands. Rightward gaze. History of prostate cancer. EXAM: CT ANGIOGRAPHY HEAD AND NECK CT PERFUSION BRAIN TECHNIQUE: Multidetector CT imaging of the head and neck was performed using the standard protocol during bolus administration of intravenous contrast. Multiplanar CT image reconstructions and MIPs were obtained to evaluate the vascular anatomy. Carotid stenosis measurements (when applicable) are obtained utilizing NASCET criteria, using the distal internal carotid diameter as the denominator. Multiphase CT imaging of the brain was performed following IV bolus contrast injection. Subsequent parametric perfusion maps were calculated using RAPID software. CONTRAST:  120mL ISOVUE-370 IOPAMIDOL (ISOVUE-370) INJECTION 76% COMPARISON:  None. FINDINGS: CTA NECK FINDINGS Aortic arch: Standard 3 vessel aortic arch with widely patent arch vessel  origins. Right carotid system: Patent with mild calcified and soft plaque at the carotid bifurcation and in the distal cervical ICA. No evidence of dissection or stenosis. Left carotid system: Patent with minimal soft plaque at the carotid bifurcation. No evidence of dissection or stenosis. Vertebral arteries: Patent without evidence dissection or stenosis. Strongly dominant left vertebral artery. Skeleton: T2 superior endplate Schmorl's node. Degenerative endplate changes with small Schmorl's nodes at C6-7. Bilateral neural foraminal stenosis at C6-7 due to uncovertebral spurring. No suspicious osseous lesion. Other neck: No evidence of acute abnormality or mass. Upper chest: No apical lung consolidation or mass. Review of the MIP images confirms the above findings CTA HEAD FINDINGS Anterior circulation: The internal carotid arteries are patent from  skull base to carotid termini with mild nonstenotic atherosclerotic plaque noted on the left. ACAs and MCAs are patent without evidence of proximal branch occlusion or significant stenosis. No aneurysm is identified. Posterior circulation: The intracranial left vertebral artery is widely patent and supplies the basilar. The right vertebral artery is hypoplastic and terminates in PICA. Patent PICA, AICA, and SCA origins are identified bilaterally. The basilar artery is patent and congenitally small without significant focal stenosis. There are fetal origins of both PCAs without evidence of significant PCA stenosis. No aneurysm is identified. Venous sinuses: Patent. Anatomic variants: Fetal origins of both PCAs. Delayed phase: No abnormal enhancement identified with note made of incomplete imaging through the inferior portion of the posterior fossa. Review of the MIP images confirms the above findings CT Brain Perfusion Findings: CBF (<30%) Volume: 0 mL Perfusion (Tmax>6.0s) volume: 0 mL Mismatch Volume: 0 mL Infarction Location: None IMPRESSION: 1. No emergent large vessel occlusion. 2. Minimal atherosclerosis in the head and neck without major branch occlusion, significant stenosis, or aneurysm. 3. No infarct or penumbra identified on perfusion imaging. Preliminary findings of no large vessel occlusion communicated in person to Dr. Rory Percy on 09/12/2018 at 9:30 a.m. Electronically Signed   By: Logan Bores M.D.   On: 09/12/2018 09:53   Mr Jeri Cos UX Contrast  Result Date: 09/12/2018 CLINICAL DATA:  Seizure. Right sided neglect. Right-sided field cut, aphasia EXAM: MRI HEAD WITHOUT AND WITH CONTRAST TECHNIQUE: Multiplanar, multiecho pulse sequences of the brain and surrounding structures were obtained without and with intravenous contrast. CONTRAST:  10 mL Gadovist IV COMPARISON:  CTA and CT perfusion 09/12/2018 FINDINGS: Brain: Patchy restricted diffusion in the posterior left parietal lobe compatible with acute  infarct in the left MCA territory. No other acute infarct. Negative for hemorrhage or mass. Ventricle size and cerebral volume normal. Patchy white matter hyperintensity bilaterally appears chronic. Brainstem normal. Negative for hemorrhage or mass. Normal enhancement postcontrast administration. Vascular: Normal arterial flow voids Skull and upper cervical spine: Negative Sinuses/Orbits: Mild mucosal edema paranasal sinuses. Normal orbit Other: None IMPRESSION: Small, patchy acute infarct in the left posterior parietal lobe Mild chronic microvascular ischemic changes in the white matter. Electronically Signed   By: Franchot Gallo M.D.   On: 09/12/2018 15:40   Ct Cerebral Perfusion W Contrast  Result Date: 09/12/2018 CLINICAL DATA:  Seizure. Not following commands. Rightward gaze. History of prostate cancer. EXAM: CT ANGIOGRAPHY HEAD AND NECK CT PERFUSION BRAIN TECHNIQUE: Multidetector CT imaging of the head and neck was performed using the standard protocol during bolus administration of intravenous contrast. Multiplanar CT image reconstructions and MIPs were obtained to evaluate the vascular anatomy. Carotid stenosis measurements (when applicable) are obtained utilizing NASCET criteria, using the distal internal carotid diameter as the denominator. Multiphase  CT imaging of the brain was performed following IV bolus contrast injection. Subsequent parametric perfusion maps were calculated using RAPID software. CONTRAST:  116mL ISOVUE-370 IOPAMIDOL (ISOVUE-370) INJECTION 76% COMPARISON:  None. FINDINGS: CTA NECK FINDINGS Aortic arch: Standard 3 vessel aortic arch with widely patent arch vessel origins. Right carotid system: Patent with mild calcified and soft plaque at the carotid bifurcation and in the distal cervical ICA. No evidence of dissection or stenosis. Left carotid system: Patent with minimal soft plaque at the carotid bifurcation. No evidence of dissection or stenosis. Vertebral arteries: Patent  without evidence dissection or stenosis. Strongly dominant left vertebral artery. Skeleton: T2 superior endplate Schmorl's node. Degenerative endplate changes with small Schmorl's nodes at C6-7. Bilateral neural foraminal stenosis at C6-7 due to uncovertebral spurring. No suspicious osseous lesion. Other neck: No evidence of acute abnormality or mass. Upper chest: No apical lung consolidation or mass. Review of the MIP images confirms the above findings CTA HEAD FINDINGS Anterior circulation: The internal carotid arteries are patent from skull base to carotid termini with mild nonstenotic atherosclerotic plaque noted on the left. ACAs and MCAs are patent without evidence of proximal branch occlusion or significant stenosis. No aneurysm is identified. Posterior circulation: The intracranial left vertebral artery is widely patent and supplies the basilar. The right vertebral artery is hypoplastic and terminates in PICA. Patent PICA, AICA, and SCA origins are identified bilaterally. The basilar artery is patent and congenitally small without significant focal stenosis. There are fetal origins of both PCAs without evidence of significant PCA stenosis. No aneurysm is identified. Venous sinuses: Patent. Anatomic variants: Fetal origins of both PCAs. Delayed phase: No abnormal enhancement identified with note made of incomplete imaging through the inferior portion of the posterior fossa. Review of the MIP images confirms the above findings CT Brain Perfusion Findings: CBF (<30%) Volume: 0 mL Perfusion (Tmax>6.0s) volume: 0 mL Mismatch Volume: 0 mL Infarction Location: None IMPRESSION: 1. No emergent large vessel occlusion. 2. Minimal atherosclerosis in the head and neck without major branch occlusion, significant stenosis, or aneurysm. 3. No infarct or penumbra identified on perfusion imaging. Preliminary findings of no large vessel occlusion communicated in person to Dr. Rory Percy on 09/12/2018 at 9:30 a.m. Electronically  Signed   By: Logan Bores M.D.   On: 09/12/2018 09:53   Ct Head Code Stroke Wo Contrast  Result Date: 09/12/2018 CLINICAL DATA:  Code stroke.  Unable to follow commands EXAM: CT HEAD WITHOUT CONTRAST TECHNIQUE: Contiguous axial images were obtained from the base of the skull through the vertex without intravenous contrast. COMPARISON:  None. FINDINGS: Brain: No evidence of acute infarction, hemorrhage, hydrocephalus, extra-axial collection or mass lesion/mass effect. Vascular: No hyperdense vessel or unexpected calcification. Skull: Normal. Negative for fracture or focal lesion. Sinuses/Orbits: Mild patchy mucosal thickening in the paranasal sinuses. Other: These results were communicated to Dr. Rory Percy at [place] 1/30/2020by text page via the [facility] messaging system. Not scored with this history IMPRESSION: Negative head CT. Electronically Signed   By: Monte Fantasia M.D.   On: 09/12/2018 09:19   Echocardiogram 1/31 IMPRESSIONS    1. The left ventricle has normal systolic function of 60-63%. The cavity size is normal. There is no left ventricular wall thickness. Echo evidence of Indeterminate diastolic filling patterns.  2. The right ventricle is mildly enlarged in size. There is normal systolic function.  3. Mildly dilated left atrial size.  4. Normal right atrial size.  5. Normal tricuspid valve.  6. No atrial level shunt detected by  color flow Doppler.         Subjective: Feeling well.  No further seizures.  No focal weakness.  No confusion, fever, headache.  No vomiting.  No numbness. No speech disturbance.  Discharge Exam: Vitals:   09/13/18 0833 09/13/18 1155  BP: 131/85 118/81  Pulse: 60 98  Resp: 17 19  Temp: 98.7 F (37.1 C) 98.2 F (36.8 C)  SpO2: 97% 98%   Vitals:   09/12/18 2341 09/13/18 0342 09/13/18 0833 09/13/18 1155  BP: 117/74 101/78 131/85 118/81  Pulse: (!) 56 (!) 58 60 98  Resp: 18 20 17 19   Temp: 97.7 F (36.5 C) 97.9 F (36.6 C) 98.7 F (37.1 C)  98.2 F (36.8 C)  TempSrc: Oral Oral Oral Oral  SpO2: 98% 96% 97% 98%  Weight:      Height:        General: Pt is alert, awake, not in acute distress Cardiovascular: RRR, nl S1-S2, no murmurs appreciated.   No LE edema.   Respiratory: Normal respiratory rate and rhythm.  CTAB without rales or wheezes. Abdominal: Abdomen soft and non-tender.  No distension or HSM.   Neuro/Psych: Strength symmetric in upper and lower extremities.  Judgment and insight appear normal.   The results of significant diagnostics from this hospitalization (including imaging, microbiology, ancillary and laboratory) are listed below for reference.     Microbiology: No results found for this or any previous visit (from the past 240 hour(s)).   Labs: BNP (last 3 results) No results for input(s): BNP in the last 8760 hours. Basic Metabolic Panel: Recent Labs  Lab 09/12/18 0905 09/12/18 0911  NA 138  --   K 4.1  --   CL 106  --   CO2 24  --   GLUCOSE 126*  --   BUN 14  --   CREATININE 1.18 1.20  CALCIUM 9.0  --    Liver Function Tests: Recent Labs  Lab 09/12/18 0905  AST 24  ALT 17  ALKPHOS 75  BILITOT 0.9  PROT 7.3  ALBUMIN 3.7   No results for input(s): LIPASE, AMYLASE in the last 168 hours. No results for input(s): AMMONIA in the last 168 hours. CBC: Recent Labs  Lab 09/12/18 0905  WBC 5.4  NEUTROABS 3.8  HGB 13.9  HCT 44.2  MCV 96.9  PLT 197   Cardiac Enzymes: No results for input(s): CKTOTAL, CKMB, CKMBINDEX, TROPONINI in the last 168 hours. BNP: Invalid input(s): POCBNP CBG: Recent Labs  Lab 09/12/18 0905 09/12/18 2215 09/13/18 0612 09/13/18 1140 09/13/18 1551  GLUCAP 122* 94 94 78 126*   D-Dimer No results for input(s): DDIMER in the last 72 hours. Hgb A1c Recent Labs    09/12/18 0905  HGBA1C 6.1*   Lipid Profile Recent Labs    09/13/18 0626  CHOL 146  HDL 55  LDLCALC 77  TRIG 69  CHOLHDL 2.7   Thyroid function studies Recent Labs     09/12/18 1827  TSH 2.408   Anemia work up No results for input(s): VITAMINB12, FOLATE, FERRITIN, TIBC, IRON, RETICCTPCT in the last 72 hours. Urinalysis    Component Value Date/Time   COLORURINE YELLOW 09/12/2018 0905   APPEARANCEUR CLEAR 09/12/2018 0905   LABSPEC 1.030 09/12/2018 0905   PHURINE 7.0 09/12/2018 [place] 09/12/2018 0905   HGBUR NEGATIVE 09/12/2018 [place] NEGATIVE 09/12/2018 [place] 09/12/2018 [place] NEGATIVE 09/12/2018 0905   NITRITE NEGATIVE 09/12/2018 0905  LEUKOCYTESUR NEGATIVE 09/12/2018 0905   Sepsis Labs Invalid input(s): PROCALCITONIN,  WBC,  LACTICIDVEN Microbiology No results found for this or any previous visit (from the past 240 hour(s)).   Time coordinating discharge: 40 minutes      SIGNED:   Edwin Dada, MD  [facility] 09/13/2018, 4:43 PM

## 2018-09-13 NOTE — Evaluation (Signed)
Physical Therapy Evaluation Patient Details Name: Peter Martinez MRN: [id number] DOB: 02/20/1951 Today's Date: 09/13/2018   History of Present Illness  68 y.o. male with medical history significant of HTN; DM; and prostate CA presenting as Code Stroke.  He reports confusion and R arm weakness. MRI shows small patchy acute infarct in the left posterior parietal lobe  Clinical Impression  Patient presents close to functional baseline with mild deficits noted with turn and stop and with R head turns, but no LOB and self corrects without difficulty.  Patient educated in some activities to work on balance/coordination and advised to return to activity slowly.  Continues to be frustrated by situation due to work issues.  Feel he may benefit from vocational rehabilitation referral or counseling to manage emotions and decrease risk for future stroke.  No further skilled PT as pt reports planned d/c home then return on Monday to complete testing.      Follow Up Recommendations No PT follow up    Equipment Recommendations  None recommended by PT    Recommendations for Other Services       Precautions / Restrictions Precautions Precautions: Other (comment) Precaution Comments: seizure Restrictions Weight Bearing Restrictions: No      Mobility  Bed Mobility Overal bed mobility: Independent                Transfers Overall transfer level: Needs assistance Equipment used: None Transfers: Sit to/from Stand Sit to Stand: Independent            Ambulation/Gait Ambulation/Gait assistance: Independent Gait Distance (Feet): 780 Feet Assistive device: None Gait Pattern/deviations: WFL(Within Functional Limits);Step-through pattern        Stairs Stairs: Yes   Stair Management: No rails;Forwards Number of Stairs: 6(2 steps x 3 reps) General stair comments: cue to use rails if needed, but pt preference not to use rails  Wheelchair Mobility    Modified Rankin (Stroke  Patients Only) Modified Rankin (Stroke Patients Only) Pre-Morbid Rankin Score: No symptoms Modified Rankin: Slight disability     Balance Overall balance assessment: Needs assistance;Independent   Sitting balance-Leahy Scale: Normal       Standing balance-Leahy Scale: Normal                   Standardized Balance Assessment Standardized Balance Assessment : Dynamic Gait Index   Dynamic Gait Index Level Surface: Normal Change in Gait Speed: Normal Gait with Horizontal Head Turns: Mild Impairment(veers with R head turns) Gait with Vertical Head Turns: Normal Gait and Pivot Turn: Moderate Impairment Step Over Obstacle: Normal Step Around Obstacles: Normal Steps: Normal Total Score: 21       Pertinent Vitals/Pain Pain Assessment: No/denies pain    Home Living Family/patient expects to be discharged to:: Private residence Living Arrangements: Spouse/significant other Available Help at Discharge: Family Type of Home: House Home Access: Ramped entrance     Home Layout: Two level;Able to live on main level with bedroom/bathroom Home Equipment: None      Prior Function Level of Independence: Independent         Comments: works in [employer], 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   Dominant Hand: Right    Extremity/Trunk Assessment   Upper Extremity Assessment Upper Extremity Assessment: Defer to OT evaluation    Lower Extremity Assessment Lower Extremity Assessment: Overall WFL for tasks assessed(intact coordination, sensation and strength)       Communication   Communication: No difficulties  Cognition Arousal/Alertness:  Awake/alert Behavior During Therapy: Flat affect Overall Cognitive Status: Within Functional Limits for tasks assessed                                        General Comments General comments (skin integrity, edema, etc.): patient feels issues related to being in bed too much; educated if  at home not quite normal still needs to practice balance and coordination and demonstrated crossing over and braiding near counter as well as work on single limb stance.  Also discussed gradual return to activities    Exercises     Assessment/Plan    PT Assessment Patent does not need any further PT services  PT Problem List         PT Treatment Interventions      PT Goals (Current goals can be found in the Care Plan section)  Acute Rehab PT Goals Patient Stated Goal: be able to return to work PT Goal Formulation: All assessment and education complete, DC therapy    Frequency     Barriers to discharge        Co-evaluation               AM-PAC PT "6 Clicks" Mobility  Outcome Measure Help needed turning from your back to your side while in a flat bed without using bedrails?: None Help needed moving from lying on your back to sitting on the side of a flat bed without using bedrails?: None Help needed moving to and from a bed to a chair (including a wheelchair)?: None Help needed standing up from a chair using your arms (e.g., wheelchair or bedside chair)?: None Help needed to walk in hospital room?: A Little Help needed climbing 3-5 steps with a railing? : A Little 6 Click Score: 22    End of Session Equipment Utilized During Treatment: Gait belt Activity Tolerance: Patient tolerated treatment well Patient left: with call bell/phone within reach;in bed   PT Visit Diagnosis: Other abnormalities of gait and mobility (R26.89)    Time: 6945-0388 PT Time Calculation (min) (ACUTE ONLY): 15 min   Charges:   PT Evaluation $PT Eval Low Complexity: [place], [place] Acute Rehabilitation Services [phone] 09/13/2018   Reginia Naas 09/13/2018, 12:51 PM

## 2018-09-13 NOTE — Evaluation (Signed)
Speech Language Pathology Evaluation Patient Details Name: Peter Martinez MRN: [id number] DOB: 03-Apr-1951 Today's Date: 09/13/2018 Time: 0017-4944 SLP Time Calculation (min) (ACUTE ONLY): 22 min  Problem List:  Patient Active Problem List   Diagnosis Date Noted  . Seizure ([place]) 09/12/2018  . CVA (cerebral vascular accident) ([place]) 09/12/2018  . Essential hypertension 09/12/2018  . Diabetes mellitus type 2 in nonobese ([place]) 09/12/2018  . Cerebral infarction due to embolism of left middle cerebral artery ([place])   . Sebaceous cyst   . Malignant neoplasm of prostate ([place]) 08/25/2016   Past Medical History:  Past Medical History:  Diagnosis Date  . Anemia   . Arthritis    psoriatic  . Cancer [facility])    prostate  . Diabetes mellitus   . Gout   . H/O mixed connective tissue disease   . History of kidney stones   . Hypertension   . Kidney stones   . Psoriasis   . Wears glasses   . Wears partial dentures    upper   Past Surgical History:  Past Surgical History:  Procedure Laterality Date  . MASS EXCISION N/A 05/11/2017   Procedure: EXCISION OF CYST ON SCALP;  Surgeon: Aviva Signs, MD;  Location: [facility];  Service: General;  Laterality: N/A;  . RADIOACTIVE SEED IMPLANT N/A 10/27/2016   Procedure: RADIOACTIVE SEED IMPLANT/BRACHYTHERAPY IMPLANT;  Surgeon: Alexis Frock, MD;  Location: [facility];  Service: Urology;  Laterality: N/A;  . TONSILLECTOMY     HPI:  Pt is a 68 y.o. male with medical history significant of HTN, DM, and prostate CA whoc presented to the ED with reports of confusion and having difficulty figuring out "some things" when he was trying to get ready for work. He went to work and had a witnessed generalized tonic-clonic seizure. He had another dermatological seizure in the ambulance with a rightward gaze according to the description from EMS. The EEG showed generalized slowing but no evidence of status epilepticus. The MRI of 1/30 revealed small,  patchy acute infarct in the left posterior parietal lobe.   Assessment / Plan / Recommendation Clinical Impression  Pt was seen for speech/language/cognition evaluation with his family present. Pt reported that prior to admission he noticed confusion but stated that this has returned to his baseline and he is no longer noticing difficulty. Pt's family also reported that the pt is now at baseline and that they have not noticed any new deficits. The pt reported that his memory has always "had a hard time" with memory and delayed recall tasks but denied any other baseline difficulty with cognition. The [facility] Cognitive Assessment was completed to evaluate the pt's cognitive-linguistic skills. He achieved a score of 20/30 which is below the normal limits of 26 or more out of 30 and is suggestive of a mild impairment. The majority of these points were lost during the delayed recall portion of this assessment. Based on informal assessment and with consideration of the reports of the pt and his family, his cognition appears to be within functional limits and further skilled SLP services are not clinically indicated at this time. Pt, his family, and nursing were all educated regarding this and verbalized understanding as well as agreement with plan of care.     SLP Assessment  SLP Recommendation/Assessment: Patient does not need any further Speech [place] Pathology Services SLP Visit Diagnosis: Cognitive communication deficit (R41.841)    Follow Up Recommendations  None    Frequency and Duration  SLP Evaluation Cognition  Overall Cognitive Status: Within Functional Limits for tasks assessed Arousal/Alertness: Awake/alert Orientation Level: Oriented X4 Attention: Focused;Sustained Focused Attention: Appears intact(Vigilance WNL: 1/1) Sustained Attention: Impaired Sustained Attention Impairment: Verbal complex(Serial 7s: 1/3) Memory: Impaired Memory Impairment: Retrieval  deficit;Decreased recall of new information(Immediate: 5/5; Delayed: 1/5; 5/5 with cues) Awareness: Appears intact Problem Solving: Appears intact Executive Function: Reasoning;Sequencing Reasoning: Appears intact(Abstraction: 1/2) Sequencing: Appears intact(Clock drawing: 3/3) Safety/Judgment: Appears intact       Comprehension  Auditory Comprehension Overall Auditory Comprehension: Appears within functional limits for tasks assessed Commands: Within Functional Limits(Able to follow complex task instructions-trail: 1/1) Conversation: Complex Reading Comprehension Reading Status: Not tested    Expression Expression Primary Mode of Expression: Verbal Verbal Expression Overall Verbal Expression: Appears within functional limits for tasks assessed Initiation: No impairment Naming: No impairment(Confrontational: 3/3; Divergent: 15/minute) Pragmatics: No impairment Written Expression Dominant Hand: Right   Oral / Motor  Motor Speech Overall Motor Speech: Appears within functional limits for tasks assessed Respiration: Within functional limits Phonation: Normal Resonance: Within functional limits Articulation: Within functional limitis Intelligibility: Intelligible Motor Planning: Witnin functional limits Motor Speech Errors: Not applicable   Maleko Greulich I. Hardin Negus, [place], [place] Office number [phone] Pager [place] 09/13/2018, 2:26 PM

## 2018-09-16 LAB — ANTINUCLEAR ANTIBODIES, IFA: ANA Ab, IFA: NEGATIVE

## 2018-09-16 SURGERY — ECHOCARDIOGRAM, TRANSESOPHAGEAL
Anesthesia: Monitor Anesthesia Care

## 2018-09-17 ENCOUNTER — Institutional Professional Consult (permissible substitution): Payer: BLUE CROSS/BLUE SHIELD | Admitting: Internal Medicine

## 2018-09-18 ENCOUNTER — Other Ambulatory Visit: Payer: Self-pay

## 2018-09-18 NOTE — Patient Outreach (Signed)
[facility]) Care Management  09/18/2018  Deanthony Maull Haak 22-Apr-1951 [id number]   EMMI- stroke RED ON EMMI ALERT Day # 1 Date: 09/17/2018 Red Alert Reason:  Feeling worse overall? Yes  Outreach attempt: spoke with patient.  Male also present on the call.  Patient states he is doing ok.  Addressed red alert with patient. Male states that the system did not record response correctly and that patient is not feeling any worse.  Patient chimes in and states he goes for a procedure on tomorrow.  Patient declined any other questions, concerns, or needs at this time.    Plan: RN CM will close case.    Jone Baseman, RN, MSN [facility] Care Management Care Management Coordinator Direct Line [phone] Toll Free: [phone]  Fax: [phone]

## 2018-09-19 ENCOUNTER — Encounter (HOSPITAL_COMMUNITY)
Admission: RE | Disposition: A | Discharge status: Home / Self Care | Payer: Self-pay | Source: Home / Self Care | Attending: Cardiology

## 2018-09-19 ENCOUNTER — Encounter (HOSPITAL_COMMUNITY): Payer: Self-pay | Admitting: *Deleted

## 2018-09-19 ENCOUNTER — Ambulatory Visit (HOSPITAL_BASED_OUTPATIENT_CLINIC_OR_DEPARTMENT_OTHER): Payer: BLUE CROSS/BLUE SHIELD

## 2018-09-19 ENCOUNTER — Ambulatory Visit (HOSPITAL_COMMUNITY)
Admission: RE | Admit: 2018-09-19 | Discharge: 2018-09-19 | Disposition: A | Discharge status: Home / Self Care | Payer: BLUE CROSS/BLUE SHIELD | Attending: Cardiology | Admitting: Cardiology

## 2018-09-19 ENCOUNTER — Other Ambulatory Visit: Payer: Self-pay

## 2018-09-19 DIAGNOSIS — E119 Type 2 diabetes mellitus without complications: Secondary | ICD-10-CM | POA: Diagnosis not present

## 2018-09-19 DIAGNOSIS — I639 Cerebral infarction, unspecified: Secondary | ICD-10-CM | POA: Diagnosis not present

## 2018-09-19 DIAGNOSIS — M109 Gout, unspecified: Secondary | ICD-10-CM | POA: Insufficient documentation

## 2018-09-19 DIAGNOSIS — R569 Unspecified convulsions: Secondary | ICD-10-CM | POA: Diagnosis not present

## 2018-09-19 DIAGNOSIS — Z8249 Family history of ischemic heart disease and other diseases of the circulatory system: Secondary | ICD-10-CM | POA: Insufficient documentation

## 2018-09-19 DIAGNOSIS — I340 Nonrheumatic mitral (valve) insufficiency: Secondary | ICD-10-CM | POA: Diagnosis not present

## 2018-09-19 DIAGNOSIS — M1990 Unspecified osteoarthritis, unspecified site: Secondary | ICD-10-CM | POA: Diagnosis not present

## 2018-09-19 DIAGNOSIS — I341 Nonrheumatic mitral (valve) prolapse: Secondary | ICD-10-CM | POA: Insufficient documentation

## 2018-09-19 DIAGNOSIS — I6389 Other cerebral infarction: Secondary | ICD-10-CM

## 2018-09-19 DIAGNOSIS — I10 Essential (primary) hypertension: Secondary | ICD-10-CM | POA: Diagnosis not present

## 2018-09-19 HISTORY — PX: LOOP RECORDER INSERTION: EP1214

## 2018-09-19 HISTORY — PX: LOOP RECORDER INSERTION: SHX6722

## 2018-09-19 HISTORY — PX: TEE WITHOUT CARDIOVERSION: SHX5443

## 2018-09-19 LAB — GLUCOSE, CAPILLARY: Glucose-Capillary: 103 mg/dL — ABNORMAL HIGH (ref 70–99)

## 2018-09-19 SURGERY — ECHOCARDIOGRAM, TRANSESOPHAGEAL
Anesthesia: Moderate Sedation

## 2018-09-19 SURGERY — LOOP RECORDER INSERTION

## 2018-09-19 MED ORDER — SODIUM CHLORIDE 0.9 % IV SOLN: INTRAVENOUS | Status: DC

## 2018-09-19 MED ORDER — MIDAZOLAM HCL (PF) 5 MG/ML IJ SOLN
INTRAMUSCULAR | Status: AC
  Filled 2018-09-19: qty 2

## 2018-09-19 MED ORDER — FENTANYL CITRATE (PF) 100 MCG/2ML IJ SOLN
INTRAMUSCULAR | Status: AC
  Filled 2018-09-19: qty 2

## 2018-09-19 MED ORDER — SODIUM CHLORIDE BACTERIOSTATIC 0.9 % IJ SOLN
INTRAMUSCULAR | Status: DC | PRN
  Administered 2018-09-19 (×2): 9 mL via INTRAVENOUS

## 2018-09-19 MED ORDER — BUTAMBEN-TETRACAINE-BENZOCAINE 2-2-14 % EX AERO
INHALATION_SPRAY | CUTANEOUS | Status: DC | PRN
  Administered 2018-09-19: 2 via TOPICAL

## 2018-09-19 MED ORDER — FENTANYL CITRATE (PF) 100 MCG/2ML IJ SOLN
INTRAMUSCULAR | Status: DC | PRN
  Administered 2018-09-19 (×3): 25 ug via INTRAVENOUS

## 2018-09-19 MED ORDER — MIDAZOLAM HCL (PF) 10 MG/2ML IJ SOLN
INTRAMUSCULAR | Status: DC | PRN
  Administered 2018-09-19: 2 mg via INTRAVENOUS
  Administered 2018-09-19: 1 mg via INTRAVENOUS
  Administered 2018-09-19: 2 mg via INTRAVENOUS

## 2018-09-19 SURGICAL SUPPLY — 2 items
LOOP REVEAL LINQSYS (Prosthesis & Implant Heart) ×2 IMPLANT
PACK LOOP INSERTION (CUSTOM PROCEDURE TRAY) ×2 IMPLANT

## 2018-09-19 NOTE — Discharge Instructions (Signed)
Implant site/wound care instructions °Keep incision clean and dry for 3 days.  °You can remove outer dressing tomorrow. °Leave steri-strips (little pieces of tape) on until seen in the office for wound check appointment. °Call the office ([phone]) for redness, drainage, swelling, or fever. ° ° °TEE ° °YOU HAD AN CARDIAC PROCEDURE TODAY: Refer to the procedure report and other information in the discharge instructions given to you for any specific questions about what was found during the examination. If this information does not answer your questions, please call [facility] office at [phone] to clarify.  ° °DIET: Your first meal following the procedure should be a light meal and then it is ok to progress to your normal diet. A half-sandwich or bowl of soup is an example of a good first meal. Heavy or fried foods are harder to digest and may make you feel nauseous or bloated. Drink plenty of fluids but you should avoid alcoholic beverages for 24 hours. If you had a esophageal dilation, please see attached instructions for diet.  ° °ACTIVITY: Your care partner should take you home directly after the procedure. You should plan to take it easy, moving slowly for the rest of the day. You can resume normal activity the day after the procedure however YOU SHOULD NOT DRIVE, use power tools, machinery or perform tasks that involve climbing or major physical exertion for 24 hours (because of the sedation medicines used during the test).  ° °SYMPTOMS TO REPORT IMMEDIATELY: °A cardiologist can be reached at any hour. Please call [phone] for any of the following symptoms:  °Vomiting of blood or coffee ground material  °New, significant abdominal pain  °New, significant chest pain or pain under the shoulder blades  °Painful or persistently difficult swallowing  °New shortness of breath  °Black, tarry-looking or red, bloody stools ° °FOLLOW UP:  °Please also call with any specific questions about appointments or  follow up tests. ° ° °

## 2018-09-19 NOTE — Interval H&P Note (Signed)
History and Physical Interval Note:  09/19/2018 8:06 AM  Peter Martinez  has presented today for surgery, with the diagnosis of stroke  The various methods of treatment have been discussed with the patient and family. After consideration of risks, benefits and other options for treatment, the patient has consented to  Procedure(s): TRANSESOPHAGEAL ECHOCARDIOGRAM (TEE) (N/A) as a surgical intervention .  The patient's history has been reviewed, patient examined, no change in status, stable for surgery.  I have reviewed the patient's chart and labs.  Questions were answered to the patient's satisfaction.     Kirk Ruths

## 2018-09-19 NOTE — Interval H&P Note (Signed)
History and Physical Interval Note:  09/19/2018 7:57 AM  Peter Martinez  has presented today for surgery, with the diagnosis of stroke  The various methods of treatment have been discussed with the patient and family. After consideration of risks, benefits and other options for treatment, the patient has consented to  Procedure(s): LOOP RECORDER INSERTION (N/A) as a surgical intervention .  The patient's history has been reviewed, patient examined, no change in status, stable for surgery.  I have reviewed the patient's chart and labs.  Questions were answered to the patient's satisfaction.     Thompson Grayer

## 2018-09-19 NOTE — Progress Notes (Signed)
    Transesophageal Echocardiogram Note  Peter Martinez [id number] 1951-01-19  Procedure: Transesophageal Echocardiogram Indications: CVA  Procedure Details Consent: Obtained Time Out: Verified patient identification, verified procedure, site/side was marked, verified correct patient position, special equipment/implants available, Radiology Safety Procedures followed,  medications/allergies/relevent history reviewed, required imaging and test results available.  Performed  Medications:  During this procedure the patient is administered a total of Versed 5 mg and Fentanyl 75 mcg  to achieve and maintain moderate conscious sedation.  The patient's heart rate, blood pressure, and oxygen saturation are monitored continuously during the procedure. The period of conscious sedation is 30 minutes, of which I was present face-to-face 100% of this time.  Normal LV function; mild prolapse of posterior MV leaflet; mild MR; no LAA thrombus; possible late positive saline microcavitation study (? Intrapulmonary shunt).   Complications: No apparent complications Patient did tolerate procedure well.  Kirk Ruths, MD

## 2018-09-19 NOTE — H&P (View-Only) (Signed)
ELECTROPHYSIOLOGY CONSULT NOTE    Primary Care Physician: Asencion Noble, MD Referring Physician:  Dr Remi Deter Date: 09/19/2018  Reason for consultation:  stroke  Peter Martinez is a 68 y.o. male with a h/o diabetes, HTN, and mixed connective tissue disorder.  HE recently presented with confusion and stroke symptoms.  He was found to have acute stroke.  He also had new seizure activity.  He was evaluated by neurology.  The patient was felt to have possible cardioembolic cause for stroke. He was felt too sick for TEE / loop recorder and was discharged with plans to return when schedule allows for consideration of these studies.  Today, he denies symptoms of palpitations, chest pain, shortness of breath, orthopnea, PND, lower extremity edema, dizziness, presyncope, syncope, or neurologic sequela. The patient is tolerating medications without difficulties and is otherwise without complaint today.   Past Medical History:  Diagnosis Date  . Anemia   . Arthritis    psoriatic  . Cancer [facility])    prostate  . Diabetes mellitus   . Gout   . H/O mixed connective tissue disease   . History of kidney stones   . Hypertension   . Kidney stones   . Psoriasis   . Wears glasses   . Wears partial dentures    upper   Past Surgical History:  Procedure Laterality Date  . MASS EXCISION N/A 05/11/2017   Procedure: EXCISION OF CYST ON SCALP;  Surgeon: Aviva Signs, MD;  Location: [facility];  Service: General;  Laterality: N/A;  . RADIOACTIVE SEED IMPLANT N/A 10/27/2016   Procedure: RADIOACTIVE SEED IMPLANT/BRACHYTHERAPY IMPLANT;  Surgeon: Alexis Frock, MD;  Location: [facility];  Service: Urology;  Laterality: N/A;  . TONSILLECTOMY       . sodium chloride      No Known Allergies  Social History   Socioeconomic History  . Marital status: Married    Spouse name: Not on file  . Number of children: Not on file  . Years of education: Not on file  . Highest education level: Not on  file  Occupational History  . Occupation: courier  . Occupation: [employer]  Social Needs  . Financial resource strain: Not on file  . Food insecurity:    Worry: Not on file    Inability: Not on file  . Transportation needs:    Medical: Not on file    Non-medical: Not on file  Tobacco Use  . Smoking status: Never Smoker  . Smokeless tobacco: Never Used  Substance and Sexual Activity  . Alcohol use: No  . Drug use: No  . Sexual activity: Not on file  Lifestyle  . Physical activity:    Days per week: Not on file    Minutes per session: Not on file  . Stress: Not on file  Relationships  . Social connections:    Talks on phone: Not on file    Gets together: Not on file    Attends religious service: Not on file    Active member of club or organization: Not on file    Attends meetings of clubs or organizations: Not on file    Relationship status: Not on file  . Intimate partner violence:    Fear of current or ex partner: Not on file    Emotionally abused: Not on file    Physically abused: Not on file    Forced sexual activity: Not on file  Other Topics Concern  .  Not on file  Social History Narrative  . Not on file    Family History  Problem Relation Age of Onset  . Breast cancer Mother   . Dementia Mother 52  . Stroke Father 30  . Leukemia Father   . Stroke Brother   . CAD Brother 6    ROS- All systems are reviewed and negative except as per the HPI above  Physical Exam: Telemetry:  sinus Vitals:   09/19/18 0736  BP: 132/75  Pulse: (!) 58  Resp: 15  Temp: 98.2 F (36.8 C)  TempSrc: Oral  SpO2: 97%  Weight: 99.8 kg  Height: 6\' 4"  (1.93 m)    GEN- The patient is well appearing, alert and oriented x 3 today.   Head- normocephalic, atraumatic Eyes-  Sclera clear, conjunctiva pink Ears- hearing intact Oropharynx- clear Neck- supple,  Lungs- Clear to ausculation bilaterally, normal work of breathing Heart- Regular  rate and rhythm, no murmurs, rubs or gallops, PMI not laterally displaced GI- soft, NT, ND, + BS Extremities- no clubbing, cyanosis, or edema MS- no significant deformity or atrophy Skin- no rash or lesion Psych- euthymic mood, full affect Neuro- strength and sensation are intact  EKG- all ekgs in epic are reviewed and reveal sinus rhythm  Labs:   Lab Results  Component Value Date   WBC 5.4 09/12/2018   HGB 13.9 09/12/2018   HCT 44.2 09/12/2018   MCV 96.9 09/12/2018   PLT 197 09/12/2018    Recent Labs  Lab 09/12/18 0905 09/12/18 0911  NA 138  --   K 4.1  --   CL 106  --   CO2 24  --   BUN 14  --   CREATININE 1.18 1.20  CALCIUM 9.0  --   PROT 7.3  --   BILITOT 0.9  --   ALKPHOS 75  --   ALT 17  --   AST 24  --   GLUCOSE 126*  --    No results found for: CKTOTAL, CKMB, CKMBINDEX, TROPONINI  Lab Results  Component Value Date   CHOL 146 09/13/2018   Lab Results  Component Value Date   HDL 55 09/13/2018   Lab Results  Component Value Date   LDLCALC 77 09/13/2018   Lab Results  Component Value Date   TRIG 69 09/13/2018   Lab Results  Component Value Date   CHOLHDL 2.7 09/13/2018   No results found for: LDLDIRECT    ASSESSMENT AND PLAN:   1. Cryptogenic stroke The patient presents with recent stroke (presumed embolic) of unknown source.  Workup this far has been unrevealing.  I share Dr Phoebe Sharps concern for possible cardioembolic source.  I would advise TEE and loop recorder implant at this time.  Risks and benefits to TEE and ILR implantation were discussed with the patient who wishes to proceed.  We also discussed remote monitoring long term for possible AF detection.  Routine wound care and follow-up are discussed.   Thompson Grayer, MD 09/19/2018  7:50 AM

## 2018-09-19 NOTE — Consult Note (Signed)
ELECTROPHYSIOLOGY CONSULT NOTE    Primary Care Physician: Asencion Noble, MD Referring Physician:  Dr Remi Deter Date: 09/19/2018  Reason for consultation:  stroke  Peter Martinez is a 68 y.o. male with a h/o diabetes, HTN, and mixed connective tissue disorder.  HE recently presented with confusion and stroke symptoms.  He was found to have acute stroke.  He also had new seizure activity.  He was evaluated by neurology.  The patient was felt to have possible cardioembolic cause for stroke. He was felt too sick for TEE / loop recorder and was discharged with plans to return when schedule allows for consideration of these studies.  Today, he denies symptoms of palpitations, chest pain, shortness of breath, orthopnea, PND, lower extremity edema, dizziness, presyncope, syncope, or neurologic sequela. The patient is tolerating medications without difficulties and is otherwise without complaint today.   Past Medical History:  Diagnosis Date  . Anemia   . Arthritis    psoriatic  . Cancer [facility])    prostate  . Diabetes mellitus   . Gout   . H/O mixed connective tissue disease   . History of kidney stones   . Hypertension   . Kidney stones   . Psoriasis   . Wears glasses   . Wears partial dentures    upper   Past Surgical History:  Procedure Laterality Date  . MASS EXCISION N/A 05/11/2017   Procedure: EXCISION OF CYST ON SCALP;  Surgeon: Aviva Signs, MD;  Location: [facility];  Service: General;  Laterality: N/A;  . RADIOACTIVE SEED IMPLANT N/A 10/27/2016   Procedure: RADIOACTIVE SEED IMPLANT/BRACHYTHERAPY IMPLANT;  Surgeon: Alexis Frock, MD;  Location: [facility];  Service: Urology;  Laterality: N/A;  . TONSILLECTOMY       . sodium chloride      No Known Allergies  Social History   Socioeconomic History  . Marital status: Married    Spouse name: Not on file  . Number of children: Not on file  . Years of education: Not on file  . Highest education level: Not on  file  Occupational History  . Occupation: courier  . Occupation: [employer]  Social Needs  . Financial resource strain: Not on file  . Food insecurity:    Worry: Not on file    Inability: Not on file  . Transportation needs:    Medical: Not on file    Non-medical: Not on file  Tobacco Use  . Smoking status: Never Smoker  . Smokeless tobacco: Never Used  Substance and Sexual Activity  . Alcohol use: No  . Drug use: No  . Sexual activity: Not on file  Lifestyle  . Physical activity:    Days per week: Not on file    Minutes per session: Not on file  . Stress: Not on file  Relationships  . Social connections:    Talks on phone: Not on file    Gets together: Not on file    Attends religious service: Not on file    Active member of club or organization: Not on file    Attends meetings of clubs or organizations: Not on file    Relationship status: Not on file  . Intimate partner violence:    Fear of current or ex partner: Not on file    Emotionally abused: Not on file    Physically abused: Not on file    Forced sexual activity: Not on file  Other Topics Concern  .  Not on file  Social History Narrative  . Not on file    Family History  Problem Relation Age of Onset  . Breast cancer Mother   . Dementia Mother 33  . Stroke Father 81  . Leukemia Father   . Stroke Brother   . CAD Brother 48    ROS- All systems are reviewed and negative except as per the HPI above  Physical Exam: Telemetry:  sinus Vitals:   09/19/18 0736  BP: 132/75  Pulse: (!) 58  Resp: 15  Temp: 98.2 F (36.8 C)  TempSrc: Oral  SpO2: 97%  Weight: 99.8 kg  Height: 6\' 4"  (1.93 m)    GEN- The patient is well appearing, alert and oriented x 3 today.   Head- normocephalic, atraumatic Eyes-  Sclera clear, conjunctiva pink Ears- hearing intact Oropharynx- clear Neck- supple,  Lungs- Clear to ausculation bilaterally, normal work of breathing Heart- Regular  rate and rhythm, no murmurs, rubs or gallops, PMI not laterally displaced GI- soft, NT, ND, + BS Extremities- no clubbing, cyanosis, or edema MS- no significant deformity or atrophy Skin- no rash or lesion Psych- euthymic mood, full affect Neuro- strength and sensation are intact  EKG- all ekgs in epic are reviewed and reveal sinus rhythm  Labs:   Lab Results  Component Value Date   WBC 5.4 09/12/2018   HGB 13.9 09/12/2018   HCT 44.2 09/12/2018   MCV 96.9 09/12/2018   PLT 197 09/12/2018    Recent Labs  Lab 09/12/18 0905 09/12/18 0911  NA 138  --   K 4.1  --   CL 106  --   CO2 24  --   BUN 14  --   CREATININE 1.18 1.20  CALCIUM 9.0  --   PROT 7.3  --   BILITOT 0.9  --   ALKPHOS 75  --   ALT 17  --   AST 24  --   GLUCOSE 126*  --    No results found for: CKTOTAL, CKMB, CKMBINDEX, TROPONINI  Lab Results  Component Value Date   CHOL 146 09/13/2018   Lab Results  Component Value Date   HDL 55 09/13/2018   Lab Results  Component Value Date   LDLCALC 77 09/13/2018   Lab Results  Component Value Date   TRIG 69 09/13/2018   Lab Results  Component Value Date   CHOLHDL 2.7 09/13/2018   No results found for: LDLDIRECT    ASSESSMENT AND PLAN:   1. Cryptogenic stroke The patient presents with recent stroke (presumed embolic) of unknown source.  Workup this far has been unrevealing.  I share Dr Phoebe Sharps concern for possible cardioembolic source.  I would advise TEE and loop recorder implant at this time.  Risks and benefits to TEE and ILR implantation were discussed with the patient who wishes to proceed.  We also discussed remote monitoring long term for possible AF detection.  Routine wound care and follow-up are discussed.   Thompson Grayer, MD 09/19/2018  7:50 AM

## 2018-09-19 NOTE — Progress Notes (Signed)
  Echocardiogram Echocardiogram Transesophageal has been performed.  Peter Martinez 09/19/2018, 9:45 AM

## 2018-09-20 ENCOUNTER — Other Ambulatory Visit: Payer: Self-pay

## 2018-09-20 NOTE — Patient Outreach (Signed)
[facility]) Care Management  09/20/2018  Peter Martinez Jul 15, 1951 [id number]   EMMI- Stroke  RED ON EMMI ALERT Day # 3 Date: 09/19/2018 Red Alert Reason:  Feeling worse overall? Yes  New problems walking/talking/speaking/seeing? Yes    Outreach attempt: spoke with patient.  Addressed red alerts with patient.  He reports that he feels fine and denies any new problems.  Advised patient that sometimes the system records things incorrectly but a follow up call is placed to make sure. He verbalized understanding.  Advised patient that he would continue to get automated calls and if there is some question that CM would call again.  He verbalized understanding and agreeable to automated calls.     Plan: RN CM will close case.   Jone Baseman, RN, MSN [facility] Care Management Care Management Coordinator Direct Line [phone] Toll Free: [phone]  Fax: [phone]

## 2018-09-27 DIAGNOSIS — Z8673 Personal history of transient ischemic attack (TIA), and cerebral infarction without residual deficits: Secondary | ICD-10-CM | POA: Diagnosis not present

## 2018-09-27 DIAGNOSIS — G4089 Other seizures: Secondary | ICD-10-CM | POA: Diagnosis not present

## 2018-10-02 ENCOUNTER — Ambulatory Visit (INDEPENDENT_AMBULATORY_CARE_PROVIDER_SITE_OTHER): Payer: BLUE CROSS/BLUE SHIELD | Admitting: Nurse Practitioner

## 2018-10-02 DIAGNOSIS — I639 Cerebral infarction, unspecified: Secondary | ICD-10-CM

## 2018-10-02 LAB — CUP PACEART INCLINIC DEVICE CHECK
Date Time Interrogation Session: 20200219121301
Implantable Pulse Generator Implant Date: 20200206

## 2018-10-02 NOTE — Progress Notes (Signed)
ILR wound check. Steri strips already removed. Wound well healed. Device not transmitting since 2/15 - pt had been moving transmitter daily. Asked him to leave plugged in next to his bed and send a manual transmission today to reset. Questions answered.

## 2018-10-07 ENCOUNTER — Other Ambulatory Visit (HOSPITAL_COMMUNITY): Payer: Self-pay | Admitting: Adult Health

## 2018-10-07 ENCOUNTER — Telehealth: Payer: Self-pay | Admitting: Nurse Practitioner

## 2018-10-07 NOTE — Telephone Encounter (Signed)
This was prescribed by neurologist but per patients wife they have not been able to reach anyone in that office to refill this and patient will not have enough medication to last until his appointment there. They are requesting that [facility] refill it. Please advise. Thanks, MI

## 2018-10-07 NOTE — Telephone Encounter (Signed)
°*  STAT* If patient is at the pharmacy, call can be transferred to refill team.   1. Which medications need to be refilled? (please list name of each medication and dose if known)  Need a new prescription for his Clopidogrel-   2. Which pharmacy/location (including street and city if local pharmacy) is medication to be sent to [place]  3.o they need a 30 day or 90 day supply? [place]

## 2018-10-07 NOTE — Telephone Encounter (Signed)
I spoke to the patient's wife who called to inform us that the patient will be out of Plavix prior to his OV with his neurologist later in March.  Is it ok to refill?  Thank you.

## 2018-10-07 NOTE — Telephone Encounter (Signed)
New Message:     Pt wants to know if you receive his transmission from 10-02-18?

## 2018-10-08 MED ORDER — CLOPIDOGREL BISULFATE 75 MG PO TABS: 75.0000 mg | ORAL_TABLET | Freq: Every day | ORAL | 0 refills | Status: DC

## 2018-10-08 NOTE — Telephone Encounter (Signed)
Spoke to patient and informed him that [facility] will allow a 1x refill on his Plavix.  He verbalized understanding.

## 2018-10-08 NOTE — Telephone Encounter (Signed)
Spoke w/ pt wife and instructed her that patient home monitor is working and currently up to date. Pt wife verbalized understanding.

## 2018-10-08 NOTE — Telephone Encounter (Signed)
Ok to refill x1 - then will need to get from neurology  Chanetta Marshall, NP 10/08/2018 7:56 AM

## 2018-10-22 ENCOUNTER — Ambulatory Visit (INDEPENDENT_AMBULATORY_CARE_PROVIDER_SITE_OTHER): Payer: BLUE CROSS/BLUE SHIELD | Admitting: *Deleted

## 2018-10-22 DIAGNOSIS — I639 Cerebral infarction, unspecified: Secondary | ICD-10-CM

## 2018-10-23 LAB — CUP PACEART REMOTE DEVICE CHECK
Date Time Interrogation Session: 20200310161108
Implantable Pulse Generator Implant Date: 20200206

## 2018-10-30 NOTE — Progress Notes (Signed)
[facility] Summary Report / Loop Recorder 

## 2018-11-01 ENCOUNTER — Encounter: Payer: Self-pay | Admitting: Adult Health

## 2018-11-01 ENCOUNTER — Other Ambulatory Visit: Payer: Self-pay

## 2018-11-01 ENCOUNTER — Ambulatory Visit (INDEPENDENT_AMBULATORY_CARE_PROVIDER_SITE_OTHER): Payer: BLUE CROSS/BLUE SHIELD | Admitting: Adult Health

## 2018-11-01 VITALS — BP 112/79 | HR 56 | Ht 76.0 in | Wt 232.2 lb

## 2018-11-01 DIAGNOSIS — E119 Type 2 diabetes mellitus without complications: Secondary | ICD-10-CM

## 2018-11-01 DIAGNOSIS — I63412 Cerebral infarction due to embolism of left middle cerebral artery: Secondary | ICD-10-CM

## 2018-11-01 DIAGNOSIS — I10 Essential (primary) hypertension: Secondary | ICD-10-CM

## 2018-11-01 DIAGNOSIS — R569 Unspecified convulsions: Secondary | ICD-10-CM | POA: Diagnosis not present

## 2018-11-01 DIAGNOSIS — E785 Hyperlipidemia, unspecified: Secondary | ICD-10-CM

## 2018-11-01 NOTE — Progress Notes (Signed)
[facility] [address] [place] [address] [phone]       OFFICE FOLLOW UP NOTE  [name] Date of Birth:  Aug 07, 1951 Medical Record Number:  [id number]   Reason for Referral:  hospital stroke follow up  CHIEF COMPLAINT:  Chief Complaint  Patient presents with  . Hospitalization Follow-up    Stroke follow up. Wife present. Treatment room. No new concerns at this time.     HPI: Peter Martinez is being seen today for initial visit in the office for patchy left posterior parietal infarcts embolic pattern secondary to unknown source on 09/12/2018. History obtained from patient, wife and chart review. Reviewed all radiology images and labs personally.  Peter Martinez is a 68 y.o. male with history of diabetes, prostate cancer, hypertension  who presented to [facility] ED with seizure activity.  CT head reviewed was negative for acute infarct.  CTA head and neck negative for emergent LVO but did show minimal arthrosclerosis.  CT perfusion negative for penumbra.  MRI with and without contrast showed small patchy infarcts left posterior parietal lobe with mild microvascular she may change his white matter and no evidence of mets.  2D echo showed an EF of 55-60% without cardiac source of embolus identified or PFO.  Infarct embolic pattern secondary to unknown source therefore recommended undergoing TEE to look for embolic source which was recommended outpatient as he has significant improvement of neuro status and stable to be discharged.  Recommended undergoing outpatient TEE with possible loop recorder if negative to assess for atrial fibrillation.  Initiated DAPT with aspirin 81 mg and clopidogrel 75 mg for 3 weeks then Plavix alone as he was on aspirin PTA.  Seizure activity likely secondary to acute stroke with grand mal seizure lasting approximately 1 to 2 minutes during EMS transport and was stopped after midazolam treatment.  EEG obtained and showed  generalized slowing but no evidence of status epilepticus.  Initiated Keppra 500 mg twice daily and recommended continuation at discharge along with discussion regarding no driving 6 months post seizure activity.  HTN stable during admission recommended long-term BP low normotensive range.  LDL 77 and recommended continuation of home dose simvastatin 20 mg daily.  A1c 6.1 and recommended ongoing follow-up with PCP for DM management.  Other stroke risk factors include advanced age but no prior history of stroke or seizure activity.  He is being seen today for hospital follow-up and is accompanied by his wife.  He has been stable from a stroke standpoint without residual deficits or new/recurring symptoms.  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  He is currently obtaining short-term disability through his PCP until he is able to return to driving.  He denies any recurrent seizure activity and continues on Keppra which he is tolerating well without reported side effects.  He did undergo TEE with loop recorder placement on 09/19/2018 as TEE negative for cardiac source of embolus.  Loop recorder has not shown atrial fibrillation thus far.  He continues on Plavix as he is completed DAPT and denies bleeding or bruising.  Continues on simvastatin 20 mg without side effects myalgias.  Blood pressure today 112/79.  No further concerns at this time.  Denies new or worsening stroke/TIA symptoms.    ROS:   14 system review of systems performed and negative with exception of  fatigue, shortness of breath, snoring, incontinence, urination problems, rash, itching, easy bleeding, increased thirst, joint pain, joint swelling, ecchymosis, weakness, seizure, none of sleep, decreased energy and insomnia  PMH:  Past Medical History:  Diagnosis Date  . Anemia   . Arthritis    psoriatic  . Cancer [facility])    prostate  .  Diabetes mellitus   . Gout   . H/O mixed connective tissue disease   . History of kidney stones   . Hypertension   . Kidney stones   . Psoriasis   . Wears glasses   . Wears partial dentures    upper    PSH:  Past Surgical History:  Procedure Laterality Date  . LOOP RECORDER INSERTION N/A 09/19/2018   Procedure: LOOP RECORDER INSERTION;  Surgeon: Thompson Grayer, MD;  Location: [facility];  Service: Cardiovascular;  Laterality: N/A;  . LOOP RECORDER INSERTION  09/19/2018   Procedure: LOOP RECORDER INSERTION;  Surgeon: Lelon Perla, MD;  Location: [facility] ENDOSCOPY;  Service: Cardiovascular;;  . MASS EXCISION N/A 05/11/2017   Procedure: EXCISION OF CYST ON SCALP;  Surgeon: Aviva Signs, MD;  Location: [facility];  Service: General;  Laterality: N/A;  . RADIOACTIVE SEED IMPLANT N/A 10/27/2016   Procedure: RADIOACTIVE SEED IMPLANT/BRACHYTHERAPY IMPLANT;  Surgeon: Alexis Frock, MD;  Location: [facility];  Service: Urology;  Laterality: N/A;  . TEE WITHOUT CARDIOVERSION N/A 09/19/2018   Procedure: TRANSESOPHAGEAL ECHOCARDIOGRAM (TEE);  Surgeon: Lelon Perla, MD;  Location: [facility];  Service: Cardiovascular;  Laterality: N/A;  . TONSILLECTOMY      Social History:  Social History   Socioeconomic History  . Marital status: Married    Spouse name: Not on file  . Number of children: Not on file  . Years of education: Not on file  . Highest education level: Not on file  Occupational History  . Occupation: courier  . Occupation: [employer]  Social Needs  . Financial resource strain: Not on file  . Food insecurity:    Worry: Not on file    Inability: Not on file  . Transportation needs:    Medical: Not on file    Non-medical: Not on file  Tobacco Use  . Smoking status: Never Smoker  . Smokeless tobacco: Never Used  Substance and Sexual Activity  . Alcohol use: No  . Drug use: No  . Sexual activity: Not on file   Lifestyle  . Physical activity:    Days per week: Not on file    Minutes per session: Not on file  . Stress: Not on file  Relationships  . Social connections:    Talks on phone: Not on file    Gets together: Not on file    Attends religious service: Not on file    Active member of club or organization: Not on file    Attends meetings of clubs or organizations: Not on file    Relationship status: Not on file  . Intimate partner violence:    Fear of current or ex partner: Not on file    Emotionally abused: Not on file    Physically abused: Not on file    Forced sexual activity: Not on file  Other Topics Concern  . Not on file  Social History Narrative  . Not on file    Family History:  Family History  Problem Relation Age of Onset  . Breast cancer Mother   . Dementia Mother 2  .  Stroke Father 61  . Leukemia Father   . Stroke Brother   . CAD Brother 59    Medications:   Current Outpatient Medications on File Prior to Visit  Medication Sig Dispense Refill  . acetaminophen (TYLENOL) 500 MG tablet Take 1,000 mg by mouth as needed.    Marland Kitchen allopurinol (ZYLOPRIM) 300 MG tablet Take 300 mg by mouth daily.    . cholecalciferol (VITAMIN D) 1000 units tablet Take 5,000 Units by mouth at bedtime.    . clopidogrel (PLAVIX) 75 MG tablet Take 1 tablet (75 mg total) by mouth daily. 30 tablet 0  . colchicine 0.6 MG tablet One tab po q 2 hrs until pain relief or stomach upset (Patient taking differently: Take 0.6 mg by mouth as needed. One tab po q 2 hrs until pain relief or stomach upset) 12 tablet 0  . Cyanocobalamin (B-12) 2500 MCG TABS Take 2,500 mcg by mouth daily.    . folic acid (FOLVITE) 1 MG tablet Take 1 mg by mouth daily.    . hydroxychloroquine (PLAQUENIL) 200 MG tablet Take 200 mg by mouth 2 (two) times daily.    Marland Kitchen levETIRAcetam (KEPPRA) 500 MG tablet Take 1 tablet (500 mg total) by mouth 2 (two) times daily. 60 tablet 3  . losartan (COZAAR) 100 MG tablet Take 100 mg by mouth  at bedtime.     . metFORMIN (GLUCOPHAGE) 1000 MG tablet Take 1,000 mg by mouth 2 (two) times daily with a meal.    . methotrexate (RHEUMATREX) 2.5 MG tablet Take 20 mg by mouth every Friday. On Fridays  8 tablets    . metoprolol tartrate (LOPRESSOR) 50 MG tablet Take 50 mg by mouth 2 (two) times daily.     . simvastatin (ZOCOR) 20 MG tablet Take 1 tablet (20 mg total) by mouth at bedtime. 30 tablet 30   No current facility-administered medications on file prior to visit.     Allergies:  No Known Allergies   Physical Exam  Vitals:   11/01/18 1113  BP: 112/79  Pulse: (!) 56  Weight: 232 lb 3.2 oz (105.3 kg)  Height: 6\' 4"  (1.93 m)   Body mass index is 28.26 kg/m. No exam data present  Depression screen [facility] 2/9 11/01/2018  Decreased Interest 0  Down, Depressed, Hopeless 0  PHQ - 2 Score 0     General: well developed, well nourished, pleasant middle-aged Caucasian male, seated, in no evident distress Head: head normocephalic and atraumatic.   Neck: supple with no carotid or supraclavicular bruits Cardiovascular: regular rate and rhythm, no murmurs Musculoskeletal: no deformity Skin:  no rash/petichiae Vascular:  Normal pulses all extremities  Neurologic Exam Mental Status: Awake and fully alert. Oriented to place and time. Recent and remote memory intact. Attention span, concentration and fund of knowledge appropriate. Mood and affect appropriate.  Cranial Nerves: Fundoscopic exam reveals sharp disc margins. Pupils equal, briskly reactive to light. Extraocular movements full without nystagmus. Visual fields full to confrontation. Hearing intact. Facial sensation intact. Face, tongue, palate moves normally and symmetrically.  Motor: Normal bulk and tone. Normal strength in all tested extremity muscles. Sensory.: intact to touch , pinprick , position and vibratory sensation.  Coordination: Rapid alternating movements normal in all extremities. Finger-to-nose and heel-to-shin  performed accurately bilaterally. Gait and Station: Arises from chair without difficulty. Stance is normal. Gait demonstrates normal stride length and balance. Able to heel, toe and tandem walk without difficulty.  Reflexes: 1+ and symmetric. Toes downgoing.  NIHSS  0 Modified Rankin  0    Diagnostic Data (Labs, Imaging, Testing)  CT HEAD WO CONTRAST 09/12/2018 IMPRESSION: Negative head CT.  CT ANGIO HEAD W OR WO CONTRAST CT ANGIO NECK W OR WO CONTRAST CT CEREBRAL PERFUSION W CONTRAST 09/12/2018 IMPRESSION: 1. No emergent large vessel occlusion. 2. Minimal atherosclerosis in the head and neck without major branch occlusion, significant stenosis, or aneurysm. 3. No infarct or penumbra identified on perfusion imaging.  MR BRAIN WO CONTRAST 09/12/2018 IMPRESSION: Small, patchy acute infarct in the left posterior parietal lobe Mild chronic microvascular ischemic changes in the white matter.  ECHOCARDIOGRAM 09/19/18 IMPRESSIONS  1. The left ventricle has normal systolic function of 26-20%.  2. No evidence of left ventricular regional wall motion abnormalities.  3. The right ventricle has normal systolic function. The cavity was normal.  4. There is mild dilatation of the aortic root.  5. The aortic valve is tricuspid There is mild thickening of the aortic valve.  6. Mild mitral valve prolapse.  7. The tricuspid valve was normal in structure.  8. There is redundancy of the interatrial septum.  9. No LAA thrombus. 10. Normal LV function; mildly dilated aortic root; mild prolapse of posterior MV leaflet with mild MR; late positive saline microcavitation study possibly consistent with intrapulmonary shunt.    ASSESSMENT: Peter Martinez is a 68 y.o. year old male here with patchy left posterior parietal infarct embolic secondary to unknown source on 09/12/2018. Vascular risk factors include HTN, HLD, DM.     PLAN:  1. Left parietal infarct: Continue clopidogrel 75 mg daily   and simvastatin 20 mg for secondary stroke prevention. Maintain strict control of hypertension with blood pressure goal below 130/90, diabetes with hemoglobin A1c goal below 6.5% and cholesterol with LDL cholesterol (bad cholesterol) goal below 70 mg/dL.  I also advised the patient to eat a healthy diet with plenty of whole grains, cereals, fruits and vegetables, exercise regularly with at least 30 minutes of continuous activity daily and maintain ideal body weight. 2. HTN: Advised to continue current treatment regimen.  Today's BP 112/79.  Advised to continue to monitor at home along with continued follow-up with PCP for management 3. HLD: Advised to continue current treatment regimen along with continued follow-up with PCP for future prescribing and monitoring of lipid panel 4. DMII: Advised to continue to monitor glucose levels at home along with continued follow-up with PCP for management and monitoring 5. Seizure activity: Recommended continuation of Keppra 500 mg twice daily for seizure prophylaxis.  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  He is agreeable to plan and verbalized understanding.  6. Patient was provided with information by [employer] research team regarding possible participation in [place] trial    Follow up in 6 months or call earlier if needed   Greater than 50% of time during this 25 minute visit was spent on counseling, explanation of diagnosis of left parietal infarct, reviewing risk factor management of HTN, HLD, DM, review of seizure prevention and use of AED, planning of further management along with potential future management, and discussion with patient and family answering all questions.    Venancio Poisson, AGNP-BC  [facility] [address]  [place], [place] [address]  Phone [phone] Fax [phone] Note: This document was prepared with digital dictation and possible smart phrase technology. Any transcriptional errors that result from this process are unintentional.

## 2018-11-01 NOTE — Progress Notes (Signed)
I agree with the above plan 

## 2018-11-01 NOTE — Patient Instructions (Signed)
Continue clopidogrel 75 mg daily  And Simvastatin  for secondary stroke prevention  Continue to follow up with PCP regarding cholesterol and blood pressure management   You will receive information regarding possible participation in [place] trial   Continue on keppra 500mg  twice daily for seizure prevention   Do not return to driving until 0/72/25 as this will be a total of 6 months since your stroke  Continue to monitor blood pressure at home  Maintain strict control of hypertension with blood pressure goal below 130/90, diabetes with hemoglobin A1c goal below 6.5% and cholesterol with LDL cholesterol (bad cholesterol) goal below 70 mg/dL. I also advised the patient to eat a healthy diet with plenty of whole grains, cereals, fruits and vegetables, exercise regularly and maintain ideal body weight.  Followup in the future with me in 6 months or call earlier if needed       Thank you for coming to see [place] at [facility]. I hope we have been able to provide you high quality care today.  You may receive a patient satisfaction survey over the next few weeks. We would appreciate your feedback and comments so that we may continue to improve ourselves and the health of our patients.

## 2018-11-02 ENCOUNTER — Other Ambulatory Visit: Payer: Self-pay | Admitting: Nurse Practitioner

## 2018-11-05 ENCOUNTER — Other Ambulatory Visit: Payer: Self-pay

## 2018-11-05 ENCOUNTER — Telehealth: Payer: Self-pay | Admitting: Adult Health

## 2018-11-05 MED ORDER — CLOPIDOGREL BISULFATE 75 MG PO TABS: 75.0000 mg | ORAL_TABLET | Freq: Every day | ORAL | 0 refills | Status: DC

## 2018-11-05 NOTE — Telephone Encounter (Signed)
Refill done for 90 days no refills.

## 2018-11-05 NOTE — Telephone Encounter (Signed)
Pt's wife Thayer Headings request new script for clopidogrel (PLAVIX) 75 MG tablet sent to [place], [place].   He is also wanting to participate in the research program that was discussed with him at the [facility]. Please call to advise

## 2018-11-05 NOTE — Telephone Encounter (Signed)
Katrina, Please refill Plavix with 90-day prescription without refills in case he participates in research study  Glynda, patient is interested in [place] trial.  You spoke to him and his wife last week after office visit.

## 2018-11-05 NOTE — Telephone Encounter (Signed)
Refill done for 90 days. 

## 2018-11-19 ENCOUNTER — Telehealth: Payer: Self-pay | Admitting: Adult Health

## 2018-11-19 NOTE — Telephone Encounter (Signed)
Spoke with the wife(DPR verified) and she verbalized understanding instructions given by Janett Billow, NP. She was very appreciative for the quick call back. No other questions or concerns at this time.

## 2018-11-19 NOTE — Telephone Encounter (Signed)
PCP currently manages BP. His BP too low is likely the reason why he is feeling fatigued. He should try to increase fluid intake and eat sodium. If BP does not increase over the next couple hours or becomes lower or increased symptoms, please proceed to ED for further evaluation, He should contact his PCP as well to notify them in case medication adjustments are needed.

## 2018-11-19 NOTE — Telephone Encounter (Signed)
Pts wife called stating that the pt is sleeping a lot and that his BP was on the low side, 62/48, and this was 67min after him going out and getting the mail. Taken sitting down. She is worried and wants to know if this is normal. Please advise.

## 2018-11-25 ENCOUNTER — Other Ambulatory Visit: Payer: Self-pay

## 2018-11-25 ENCOUNTER — Ambulatory Visit (INDEPENDENT_AMBULATORY_CARE_PROVIDER_SITE_OTHER): Payer: BLUE CROSS/BLUE SHIELD | Admitting: *Deleted

## 2018-11-25 DIAGNOSIS — I639 Cerebral infarction, unspecified: Secondary | ICD-10-CM | POA: Diagnosis not present

## 2018-11-25 LAB — CUP PACEART REMOTE DEVICE CHECK
Date Time Interrogation Session: 20200412163556
Implantable Pulse Generator Implant Date: 20200206

## 2018-11-26 DIAGNOSIS — E785 Hyperlipidemia, unspecified: Secondary | ICD-10-CM | POA: Diagnosis not present

## 2018-11-26 DIAGNOSIS — E1159 Type 2 diabetes mellitus with other circulatory complications: Secondary | ICD-10-CM | POA: Diagnosis not present

## 2018-12-02 DIAGNOSIS — E1159 Type 2 diabetes mellitus with other circulatory complications: Secondary | ICD-10-CM | POA: Diagnosis not present

## 2018-12-02 DIAGNOSIS — Z8673 Personal history of transient ischemic attack (TIA), and cerebral infarction without residual deficits: Secondary | ICD-10-CM | POA: Diagnosis not present

## 2018-12-06 ENCOUNTER — Telehealth: Payer: Self-pay

## 2018-12-06 NOTE — Progress Notes (Signed)
[facility] Summary Report / Loop Recorder 

## 2018-12-06 NOTE — Telephone Encounter (Signed)
Spoke with patient regarding disconnected monitor 

## 2018-12-27 ENCOUNTER — Ambulatory Visit (INDEPENDENT_AMBULATORY_CARE_PROVIDER_SITE_OTHER): Payer: BLUE CROSS/BLUE SHIELD | Admitting: *Deleted

## 2018-12-27 ENCOUNTER — Other Ambulatory Visit: Payer: Self-pay

## 2018-12-27 DIAGNOSIS — I639 Cerebral infarction, unspecified: Secondary | ICD-10-CM | POA: Diagnosis not present

## 2018-12-28 LAB — CUP PACEART REMOTE DEVICE CHECK
Date Time Interrogation Session: 20200515173820
Implantable Pulse Generator Implant Date: 20200206

## 2018-12-31 ENCOUNTER — Other Ambulatory Visit: Payer: Self-pay | Admitting: Adult Health

## 2018-12-31 NOTE — Progress Notes (Signed)
[facility] Summary Report / Loop Recorder 

## 2019-01-29 ENCOUNTER — Ambulatory Visit (INDEPENDENT_AMBULATORY_CARE_PROVIDER_SITE_OTHER): Payer: BC Managed Care – PPO | Admitting: *Deleted

## 2019-01-29 DIAGNOSIS — I639 Cerebral infarction, unspecified: Secondary | ICD-10-CM | POA: Diagnosis not present

## 2019-01-30 LAB — CUP PACEART REMOTE DEVICE CHECK
Date Time Interrogation Session: 20200617181052
Implantable Pulse Generator Implant Date: 20200206

## 2019-02-12 NOTE — Progress Notes (Signed)
[facility] Summary Report / Loop Recorder 

## 2019-02-28 ENCOUNTER — Other Ambulatory Visit: Payer: Self-pay | Admitting: Adult Health

## 2019-03-03 ENCOUNTER — Ambulatory Visit (INDEPENDENT_AMBULATORY_CARE_PROVIDER_SITE_OTHER): Payer: BC Managed Care – PPO | Admitting: *Deleted

## 2019-03-03 ENCOUNTER — Other Ambulatory Visit: Payer: Self-pay

## 2019-03-03 DIAGNOSIS — I639 Cerebral infarction, unspecified: Secondary | ICD-10-CM

## 2019-03-03 LAB — CUP PACEART REMOTE DEVICE CHECK
Date Time Interrogation Session: 20200720181534
Implantable Pulse Generator Implant Date: 20200206

## 2019-03-03 MED ORDER — CLOPIDOGREL BISULFATE 75 MG PO TABS: 75.0000 mg | ORAL_TABLET | Freq: Every day | ORAL | 1 refills | Status: DC

## 2019-03-19 NOTE — Progress Notes (Signed)
[facility] Summary Report / Loop Recorder 

## 2019-03-24 DIAGNOSIS — E1159 Type 2 diabetes mellitus with other circulatory complications: Secondary | ICD-10-CM | POA: Diagnosis not present

## 2019-03-24 DIAGNOSIS — I10 Essential (primary) hypertension: Secondary | ICD-10-CM | POA: Diagnosis not present

## 2019-03-31 DIAGNOSIS — E1159 Type 2 diabetes mellitus with other circulatory complications: Secondary | ICD-10-CM | POA: Diagnosis not present

## 2019-03-31 DIAGNOSIS — I10 Essential (primary) hypertension: Secondary | ICD-10-CM | POA: Diagnosis not present

## 2019-04-07 ENCOUNTER — Ambulatory Visit (INDEPENDENT_AMBULATORY_CARE_PROVIDER_SITE_OTHER): Payer: BC Managed Care – PPO | Admitting: *Deleted

## 2019-04-07 DIAGNOSIS — I6389 Other cerebral infarction: Secondary | ICD-10-CM | POA: Diagnosis not present

## 2019-04-07 DIAGNOSIS — I639 Cerebral infarction, unspecified: Secondary | ICD-10-CM

## 2019-04-08 LAB — CUP PACEART REMOTE DEVICE CHECK
Date Time Interrogation Session: 20200822193844
Implantable Pulse Generator Implant Date: 20200206

## 2019-04-14 NOTE — Progress Notes (Signed)
[facility] Summary Report / Loop Recorder 

## 2019-05-05 ENCOUNTER — Ambulatory Visit: Payer: BLUE CROSS/BLUE SHIELD | Admitting: Adult Health

## 2019-05-08 ENCOUNTER — Ambulatory Visit (INDEPENDENT_AMBULATORY_CARE_PROVIDER_SITE_OTHER): Payer: BC Managed Care – PPO | Admitting: *Deleted

## 2019-05-08 DIAGNOSIS — I639 Cerebral infarction, unspecified: Secondary | ICD-10-CM

## 2019-05-08 LAB — CUP PACEART REMOTE DEVICE CHECK
Date Time Interrogation Session: 20200923194748
Implantable Pulse Generator Implant Date: 20200206

## 2019-05-09 ENCOUNTER — Other Ambulatory Visit: Payer: Self-pay

## 2019-05-09 LAB — CUP PACEART REMOTE DEVICE CHECK
Date Time Interrogation Session: 20200924204050
Implantable Pulse Generator Implant Date: 20200206

## 2019-05-16 NOTE — Progress Notes (Signed)
[facility] Summary Report / Loop Recorder 

## 2019-05-19 ENCOUNTER — Other Ambulatory Visit: Payer: Self-pay | Admitting: Adult Health

## 2019-06-10 ENCOUNTER — Ambulatory Visit (INDEPENDENT_AMBULATORY_CARE_PROVIDER_SITE_OTHER): Payer: BC Managed Care – PPO | Admitting: *Deleted

## 2019-06-10 DIAGNOSIS — I63412 Cerebral infarction due to embolism of left middle cerebral artery: Secondary | ICD-10-CM | POA: Diagnosis not present

## 2019-06-11 LAB — CUP PACEART REMOTE DEVICE CHECK
Date Time Interrogation Session: 20201027204224
Implantable Pulse Generator Implant Date: 20200206

## 2019-07-02 NOTE — Progress Notes (Signed)
[facility] Summary Report / Loop Recorder 

## 2019-07-14 ENCOUNTER — Ambulatory Visit (INDEPENDENT_AMBULATORY_CARE_PROVIDER_SITE_OTHER): Payer: BC Managed Care – PPO | Admitting: *Deleted

## 2019-07-14 DIAGNOSIS — I63412 Cerebral infarction due to embolism of left middle cerebral artery: Secondary | ICD-10-CM | POA: Diagnosis not present

## 2019-07-14 LAB — CUP PACEART REMOTE DEVICE CHECK
Date Time Interrogation Session: 20201129154201
Implantable Pulse Generator Implant Date: 20200206

## 2019-07-21 ENCOUNTER — Ambulatory Visit (INDEPENDENT_AMBULATORY_CARE_PROVIDER_SITE_OTHER): Payer: BC Managed Care – PPO | Admitting: Adult Health

## 2019-07-21 ENCOUNTER — Other Ambulatory Visit: Payer: Self-pay

## 2019-07-21 ENCOUNTER — Encounter: Payer: Self-pay | Admitting: Adult Health

## 2019-07-21 VITALS — BP 117/76 | HR 54 | Temp 97.3°F | Ht 76.0 in | Wt 248.4 lb

## 2019-07-21 DIAGNOSIS — I639 Cerebral infarction, unspecified: Secondary | ICD-10-CM | POA: Diagnosis not present

## 2019-07-21 DIAGNOSIS — R569 Unspecified convulsions: Secondary | ICD-10-CM

## 2019-07-21 DIAGNOSIS — I10 Essential (primary) hypertension: Secondary | ICD-10-CM

## 2019-07-21 DIAGNOSIS — E119 Type 2 diabetes mellitus without complications: Secondary | ICD-10-CM | POA: Diagnosis not present

## 2019-07-21 DIAGNOSIS — E785 Hyperlipidemia, unspecified: Secondary | ICD-10-CM | POA: Diagnosis not present

## 2019-07-21 NOTE — Progress Notes (Signed)
[facility] [address] [place] [address] [phone]       OFFICE FOLLOW UP NOTE  [name] Date of Birth:  Apr 24, 1951 Medical Record Number:  [id number]   Reason for Referral: Cryptogenic stroke follow up  CHIEF COMPLAINT:  Chief Complaint  Patient presents with   Follow-up    6 mon f/u. Alone. Rm 9. No new concerns at this time.     HPI: Stroke admission 09/12/2018: Peter Martinez is a 68 y.o. male with history of diabetes, prostate cancer, hypertension  who presented to [facility] ED with seizure activity.  Stroke work-up showed patchy left posterior parietal infarcts embolic pattern secondary to unknown source.  CT head reviewed was negative for acute infarct.  CTA head and neck negative for emergent LVO but did show minimal arthrosclerosis.  CT perfusion negative for penumbra.  MRI with and without contrast showed small patchy infarcts left posterior parietal lobe with mild microvascular she may change his white matter and no evidence of mets.  2D echo showed an EF of 55-60% without cardiac source of embolus identified or PFO.  Infarct embolic pattern secondary to unknown source therefore recommended undergoing TEE to look for embolic source which was recommended outpatient as he has significant improvement of neuro status and stable to be discharged.  Recommended undergoing outpatient TEE with possible loop recorder if negative to assess for atrial fibrillation.  Initiated DAPT with aspirin 81 mg and clopidogrel 75 mg for 3 weeks then Plavix alone as he was on aspirin PTA.  Seizure activity likely secondary to acute stroke with grand mal seizure lasting approximately 1 to 2 minutes during EMS transport and was stopped after midazolam treatment.  EEG obtained and showed generalized slowing but no evidence of status epilepticus.  Initiated Keppra 500 mg twice daily and recommended continuation at discharge along with discussion regarding no driving 6  months post seizure activity.  HTN stable during admission recommended long-term BP low normotensive range.  LDL 77 and recommended continuation of home dose simvastatin 20 mg daily.  A1c 6.1 and recommended ongoing follow-up with PCP for DM management.  Other stroke risk factors include advanced age but no prior history of stroke or seizure activity.  Initial visit 11/01/2018: He is being seen today for hospital follow-up and is accompanied by his wife.  He has been stable from a stroke standpoint without residual deficits or new/recurring symptoms.  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  He is currently obtaining short-term disability through his PCP until he is able to return to driving.  He denies any recurrent seizure activity and continues on Keppra which he is tolerating well without reported side effects.  He did undergo TEE with loop recorder placement on 09/19/2018 as TEE negative for cardiac source of embolus.  Loop recorder has not shown atrial fibrillation thus far.  He continues on Plavix as he is completed DAPT and denies bleeding or bruising.  Continues on simvastatin 20 mg without side effects myalgias.  Blood pressure today 112/79.  No further concerns at this time.  Denies new or worsening stroke/TIA symptoms.  Update 07/21/2019: Peter Martinez is a 68 year old male who is being seen today for stroke follow-up.  He has been doing well from a stroke standpoint without residual deficits or reoccurring of symptoms.  He continues on Plavix 75 mg daily  and simvastatin 20 mg daily for secondary stroke prevention without side effects.  He had lab work obtained today which she believes included lipid panel.  Blood pressure today 117/76.  Continues on Keppra 500 mg twice daily without reoccurring seizure activity or symptoms and tolerating well without side effects.  He has returned back to work and  driving without difficulty.  Loop recorder has not shown atrial fibrillation thus far.  No concerns at this time.     ROS:   14 system review of systems performed and negative with exception of no concerns  PMH:  Past Medical History:  Diagnosis Date   Anemia    Arthritis    psoriatic   Cancer ([place])    prostate   Diabetes mellitus    Gout    H/O mixed connective tissue disease    History of kidney stones    Hypertension    Kidney stones    Psoriasis    Wears glasses    Wears partial dentures    upper    PSH:  Past Surgical History:  Procedure Laterality Date   LOOP RECORDER INSERTION N/A 09/19/2018   Procedure: LOOP RECORDER INSERTION;  Surgeon: Thompson Grayer, MD;  Location: [facility];  Service: Cardiovascular;  Laterality: N/A;   LOOP RECORDER INSERTION  09/19/2018   Procedure: LOOP RECORDER INSERTION;  Surgeon: Lelon Perla, MD;  Location: [facility];  Service: Cardiovascular;;   MASS EXCISION N/A 05/11/2017   Procedure: EXCISION OF CYST ON SCALP;  Surgeon: Aviva Signs, MD;  Location: [facility];  Service: General;  Laterality: N/A;   RADIOACTIVE SEED IMPLANT N/A 10/27/2016   Procedure: RADIOACTIVE SEED IMPLANT/BRACHYTHERAPY IMPLANT;  Surgeon: Alexis Frock, MD;  Location: [facility];  Service: Urology;  Laterality: N/A;   TEE WITHOUT CARDIOVERSION N/A 09/19/2018   Procedure: TRANSESOPHAGEAL ECHOCARDIOGRAM (TEE);  Surgeon: Lelon Perla, MD;  Location: [facility];  Service: Cardiovascular;  Laterality: N/A;   TONSILLECTOMY      Social History:  Social History   Socioeconomic History   Marital status: Married    Spouse name: Not on file   Number of children: Not on file   Years of education: Not on file   Highest education level: Not on file  Occupational History   Occupation: courier   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  Social Needs   Financial resource strain: Not on file   Food  insecurity    Worry: Not on file    Inability: Not on file   Transportation needs    Medical: Not on file    Non-medical: Not on file  Tobacco Use   Smoking status: Never Smoker   Smokeless tobacco: Never Used  Substance and Sexual Activity   Alcohol use: No   Drug use: No   Sexual activity: Not on file  Lifestyle   Physical activity    Days per week: Not on file    Minutes per session: Not on file   Stress: Not on file  Relationships   Social connections    Talks on phone: Not on file    Gets together: Not on file    Attends religious service: Not on file    Active member of club or organization: Not on file    Attends meetings of clubs or organizations: Not on file    Relationship status: Not on file   Intimate partner violence    Fear of current or ex partner: Not on file  Emotionally abused: Not on file    Physically abused: Not on file    Forced sexual activity: Not on file  Other Topics Concern   Not on file  Social History Narrative   Not on file    Family History:  Family History  Problem Relation Age of Onset   Breast cancer Mother    Dementia Mother 15   Stroke Father 58   Leukemia Father    Stroke Brother    CAD Brother 8    Medications:   Current Outpatient Medications on File Prior to Visit  Medication Sig Dispense Refill   acetaminophen (TYLENOL) 500 MG tablet Take 1,000 mg by mouth as needed.     allopurinol (ZYLOPRIM) 300 MG tablet Take 300 mg by mouth daily.     cholecalciferol (VITAMIN D) 1000 units tablet Take 5,000 Units by mouth at bedtime.     clopidogrel (PLAVIX) 75 MG tablet TAKE 1 TABLET BY MOUTH ONCE DAILY. 90 tablet 0   colchicine 0.6 MG tablet One tab po q 2 hrs until pain relief or stomach upset (Patient taking differently: Take 0.6 mg by mouth as needed. One tab po q 2 hrs until pain relief or stomach upset) 12 tablet 0   Cyanocobalamin (B-12) 2500 MCG TABS Take 2,500 mcg by mouth daily.     folic acid  (FOLVITE) 1 MG tablet Take 1 mg by mouth daily.     hydroxychloroquine (PLAQUENIL) 200 MG tablet Take 200 mg by mouth 2 (two) times daily.     levETIRAcetam (KEPPRA) 500 MG tablet Take 1 tablet (500 mg total) by mouth 2 (two) times daily. 60 tablet 3   metFORMIN (GLUCOPHAGE) 1000 MG tablet Take 1,000 mg by mouth 2 (two) times daily with a meal.     methotrexate (RHEUMATREX) 2.5 MG tablet Take 20 mg by mouth every Friday. On Fridays  8 tablets     metoprolol tartrate (LOPRESSOR) 50 MG tablet Take 50 mg by mouth 2 (two) times daily.      simvastatin (ZOCOR) 20 MG tablet Take 1 tablet (20 mg total) by mouth at bedtime. 30 tablet 30   No current facility-administered medications on file prior to visit.     Allergies:  No Known Allergies   Physical Exam  Vitals:   07/21/19 1235  Height: 6\' 4"  (1.93 m)   Body mass index is 28.26 kg/m. No exam data present  General: well developed, well nourished, pleasant middle-aged Caucasian male, seated, in no evident distress Head: head normocephalic and atraumatic.   Neck: supple with no carotid or supraclavicular bruits Cardiovascular: regular rate and rhythm, no murmurs Musculoskeletal: no deformity Skin:  no rash/petichiae Vascular:  Normal pulses all extremities  Neurologic Exam Mental Status: Awake and fully alert.  Normal speech and language.  Oriented to place and time. Recent and remote memory intact. Attention span, concentration and fund of knowledge appropriate. Mood and affect appropriate.  Cranial Nerves: Pupils equal, briskly reactive to light. Extraocular movements full without nystagmus. Visual fields full to confrontation. Hearing intact. Facial sensation intact. Face, tongue, palate moves normally and symmetrically.  Motor: Normal bulk and tone. Normal strength in all tested extremity muscles. Sensory.: intact to touch , pinprick , position and vibratory sensation.  Coordination: Rapid alternating movements normal in all  extremities. Finger-to-nose and heel-to-shin performed accurately bilaterally. Gait and Station: Arises from chair without difficulty. Stance is normal. Gait demonstrates normal stride length and balance. Able to heel, toe and tandem walk without difficulty.  Reflexes: 1+ and symmetric. Toes downgoing.      Diagnostic Data (Labs, Imaging, Testing)  CT HEAD WO CONTRAST 09/12/2018 IMPRESSION: Negative head CT.  CT ANGIO HEAD W OR WO CONTRAST CT ANGIO NECK W OR WO CONTRAST CT CEREBRAL PERFUSION W CONTRAST 09/12/2018 IMPRESSION: 1. No emergent large vessel occlusion. 2. Minimal atherosclerosis in the head and neck without major branch occlusion, significant stenosis, or aneurysm. 3. No infarct or penumbra identified on perfusion imaging.  MR BRAIN WO CONTRAST 09/12/2018 IMPRESSION: Small, patchy acute infarct in the left posterior parietal lobe Mild chronic microvascular ischemic changes in the white matter.  ECHOCARDIOGRAM 09/19/18 IMPRESSIONS  1. The left ventricle has normal systolic function of 0000000.  2. No evidence of left ventricular regional wall motion abnormalities.  3. The right ventricle has normal systolic function. The cavity was normal.  4. There is mild dilatation of the aortic root.  5. The aortic valve is tricuspid There is mild thickening of the aortic valve.  6. Mild mitral valve prolapse.  7. The tricuspid valve was normal in structure.  8. There is redundancy of the interatrial septum.  9. No LAA thrombus. 10. Normal LV function; mildly dilated aortic root; mild prolapse of posterior MV leaflet with mild MR; late positive saline microcavitation study possibly consistent with intrapulmonary shunt.    ASSESSMENT: VEDANSH MURATALLA is a 68 y.o. year old male here with patchy left posterior parietal infarct embolic secondary to unknown source on 09/12/2018 with subsequent seizure activity.  Loop recorder placed for long-term monitoring of potential atrial  fibrillation and stroke etiology.  Initiated Keppra 500 mg twice daily for seizure prophylaxis.  Vascular risk factors include HTN, HLD, DM.  Recovered well from a stroke standpoint without residual deficits.  Loop recorder is not shown atrial fibrillation thus far.  No reoccurring seizure activity with ongoing use of Keppra 500 mg twice daily.    PLAN:  1. Left parietal infarct: Continue clopidogrel 75 mg daily  and simvastatin 20 mg for secondary stroke prevention. Maintain strict control of hypertension with blood pressure goal below 130/90, diabetes with hemoglobin A1c goal below 6.5% and cholesterol with LDL cholesterol (bad cholesterol) goal below 70 mg/dL.  I also advised the patient to eat a healthy diet with plenty of whole grains, cereals, fruits and vegetables, exercise regularly with at least 30 minutes of continuous activity daily and maintain ideal body weight.  Loop recorder will continue to be monitored for potential atrial fibrillation. 2. HTN: Advised to continue current treatment regimen. Advised to continue to monitor at home along with continued follow-up with PCP for management 3. HLD: Advised to continue current treatment regimen along with continued follow-up with PCP for future prescribing and monitoring of lipid panel 4. DMII: Advised to continue to monitor glucose levels at home along with continued follow-up with PCP for management and monitoring 5. Seizure, post stroke: Continuation of Keppra 500 mg daily for seizure prophylaxis   Advised patient to follow-up in 1 year for ongoing management and monitoring of seizure post stroke and prescribing of Keppra.  Patient believes PCP will be willing to prescribe ongoing therefore can follow-up as needed at this time.  Advised patient to call to schedule follow-up visit with any questions or concerns or if PCP would like [place] to manage and prescribe Keppra.  Patient verbalized understanding   Greater than 50% of time during this 25  minute visit was spent on counseling, explanation of diagnosis of left parietal infarct, reviewing risk factor management  of HTN, HLD, DM, review of seizure prevention and use of AED, planning of further management along with potential future management, and discussion with patient answering all questions to satisfaction    Frann Rider, [facility]  [facility] [address], [place] [address]  Phone [phone] Fax [phone] Note: This document was prepared with digital dictation and possible smart phrase technology. Any transcriptional errors that result from this process are unintentional.

## 2019-07-21 NOTE — Progress Notes (Signed)
I agree with the above plan 

## 2019-07-21 NOTE — Patient Instructions (Signed)
Continue clopidogrel 75 mg daily  and simvastatin for secondary stroke prevention  Continue Keppra 500 mg twice daily for seizure prevention  Continue to follow up with PCP regarding cholesterol, blood pressure and diabetes management   Loop recorder will continue to be monitored and you will be notified with any abnormal findings  Continue to monitor blood pressure at home  Maintain strict control of hypertension with blood pressure goal below 130/90, diabetes with hemoglobin A1c goal below 6.5% and cholesterol with LDL cholesterol (bad cholesterol) goal below 70 mg/dL. I also advised the patient to eat a healthy diet with plenty of whole grains, cereals, fruits and vegetables, exercise regularly and maintain ideal body weight.  Follow-up in 1 year or call earlier if needed       Thank you for coming to see [place] at [facility]. I hope we have been able to provide you high quality care today.  You may receive a patient satisfaction survey over the next few weeks. We would appreciate your feedback and comments so that we may continue to improve ourselves and the health of our patients.

## 2019-08-06 NOTE — Progress Notes (Signed)
ILR remote 

## 2019-08-14 ENCOUNTER — Ambulatory Visit (INDEPENDENT_AMBULATORY_CARE_PROVIDER_SITE_OTHER): Payer: BC Managed Care – PPO | Admitting: *Deleted

## 2019-08-14 DIAGNOSIS — I63412 Cerebral infarction due to embolism of left middle cerebral artery: Secondary | ICD-10-CM | POA: Diagnosis not present

## 2019-08-14 LAB — CUP PACEART REMOTE DEVICE CHECK
Date Time Interrogation Session: 20201230232401
Implantable Pulse Generator Implant Date: 20200206

## 2019-08-15 NOTE — Progress Notes (Signed)
ILR remote 

## 2019-08-18 DIAGNOSIS — E1151 Type 2 diabetes mellitus with diabetic peripheral angiopathy without gangrene: Secondary | ICD-10-CM | POA: Diagnosis not present

## 2019-08-18 DIAGNOSIS — E1159 Type 2 diabetes mellitus with other circulatory complications: Secondary | ICD-10-CM | POA: Diagnosis not present

## 2019-08-25 DIAGNOSIS — Z8546 Personal history of malignant neoplasm of prostate: Secondary | ICD-10-CM | POA: Diagnosis not present

## 2019-08-25 DIAGNOSIS — E1159 Type 2 diabetes mellitus with other circulatory complications: Secondary | ICD-10-CM | POA: Diagnosis not present

## 2019-09-15 ENCOUNTER — Ambulatory Visit (INDEPENDENT_AMBULATORY_CARE_PROVIDER_SITE_OTHER): Payer: BC Managed Care – PPO | Admitting: *Deleted

## 2019-09-15 DIAGNOSIS — I63412 Cerebral infarction due to embolism of left middle cerebral artery: Secondary | ICD-10-CM | POA: Diagnosis not present

## 2019-09-15 LAB — CUP PACEART REMOTE DEVICE CHECK
Date Time Interrogation Session: 20210201004505
Implantable Pulse Generator Implant Date: 20200206

## 2019-09-15 NOTE — Progress Notes (Signed)
ILR Remote 

## 2019-10-06 DIAGNOSIS — R3915 Urgency of urination: Secondary | ICD-10-CM | POA: Diagnosis not present

## 2019-10-06 DIAGNOSIS — N5201 Erectile dysfunction due to arterial insufficiency: Secondary | ICD-10-CM | POA: Diagnosis not present

## 2019-10-06 DIAGNOSIS — C61 Malignant neoplasm of prostate: Secondary | ICD-10-CM | POA: Diagnosis not present

## 2019-10-16 ENCOUNTER — Ambulatory Visit (INDEPENDENT_AMBULATORY_CARE_PROVIDER_SITE_OTHER): Payer: BC Managed Care – PPO | Admitting: *Deleted

## 2019-10-16 DIAGNOSIS — I63412 Cerebral infarction due to embolism of left middle cerebral artery: Secondary | ICD-10-CM | POA: Diagnosis not present

## 2019-10-16 LAB — CUP PACEART REMOTE DEVICE CHECK
Date Time Interrogation Session: 20210304031546
Implantable Pulse Generator Implant Date: 20200206

## 2019-10-16 NOTE — Progress Notes (Signed)
ILR Remote 

## 2019-10-20 DIAGNOSIS — E790 Hyperuricemia without signs of inflammatory arthritis and tophaceous disease: Secondary | ICD-10-CM | POA: Diagnosis not present

## 2019-10-20 DIAGNOSIS — L4050 Arthropathic psoriasis, unspecified: Secondary | ICD-10-CM | POA: Diagnosis not present

## 2019-10-20 DIAGNOSIS — Z79899 Other long term (current) drug therapy: Secondary | ICD-10-CM | POA: Diagnosis not present

## 2019-10-27 DIAGNOSIS — M3589 Other specified systemic involvement of connective tissue: Secondary | ICD-10-CM | POA: Diagnosis not present

## 2019-10-27 DIAGNOSIS — E663 Overweight: Secondary | ICD-10-CM | POA: Diagnosis not present

## 2019-10-27 DIAGNOSIS — E559 Vitamin D deficiency, unspecified: Secondary | ICD-10-CM | POA: Diagnosis not present

## 2019-10-27 DIAGNOSIS — L4050 Arthropathic psoriasis, unspecified: Secondary | ICD-10-CM | POA: Diagnosis not present

## 2019-11-17 ENCOUNTER — Ambulatory Visit (INDEPENDENT_AMBULATORY_CARE_PROVIDER_SITE_OTHER): Payer: BC Managed Care – PPO | Admitting: *Deleted

## 2019-11-17 DIAGNOSIS — I63412 Cerebral infarction due to embolism of left middle cerebral artery: Secondary | ICD-10-CM | POA: Diagnosis not present

## 2019-11-17 LAB — CUP PACEART REMOTE DEVICE CHECK
Date Time Interrogation Session: 20210404041756
Implantable Pulse Generator Implant Date: 20200206

## 2019-11-18 NOTE — Progress Notes (Signed)
ILR remote 

## 2019-12-08 DIAGNOSIS — I10 Essential (primary) hypertension: Secondary | ICD-10-CM | POA: Diagnosis not present

## 2019-12-08 DIAGNOSIS — E785 Hyperlipidemia, unspecified: Secondary | ICD-10-CM | POA: Diagnosis not present

## 2019-12-08 DIAGNOSIS — E1159 Type 2 diabetes mellitus with other circulatory complications: Secondary | ICD-10-CM | POA: Diagnosis not present

## 2019-12-15 DIAGNOSIS — L97529 Non-pressure chronic ulcer of other part of left foot with unspecified severity: Secondary | ICD-10-CM | POA: Diagnosis not present

## 2019-12-15 DIAGNOSIS — E1129 Type 2 diabetes mellitus with other diabetic kidney complication: Secondary | ICD-10-CM | POA: Diagnosis not present

## 2019-12-15 DIAGNOSIS — Z6830 Body mass index (BMI) 30.0-30.9, adult: Secondary | ICD-10-CM | POA: Diagnosis not present

## 2019-12-15 DIAGNOSIS — E785 Hyperlipidemia, unspecified: Secondary | ICD-10-CM | POA: Diagnosis not present

## 2019-12-19 ENCOUNTER — Ambulatory Visit: Payer: Self-pay | Admitting: Physician Assistant

## 2019-12-20 LAB — CUP PACEART REMOTE DEVICE CHECK
Date Time Interrogation Session: 20210505042206
Implantable Pulse Generator Implant Date: 20200206

## 2019-12-22 ENCOUNTER — Ambulatory Visit (INDEPENDENT_AMBULATORY_CARE_PROVIDER_SITE_OTHER): Payer: BC Managed Care – PPO | Admitting: *Deleted

## 2019-12-22 DIAGNOSIS — I63412 Cerebral infarction due to embolism of left middle cerebral artery: Secondary | ICD-10-CM | POA: Diagnosis not present

## 2019-12-23 ENCOUNTER — Ambulatory Visit: Payer: Self-pay | Admitting: Physician Assistant

## 2019-12-23 NOTE — Progress Notes (Signed)
[facility] Summary Report / Loop Recorder 

## 2019-12-31 ENCOUNTER — Encounter: Payer: BC Managed Care – PPO | Attending: Internal Medicine | Admitting: Internal Medicine

## 2019-12-31 ENCOUNTER — Other Ambulatory Visit: Payer: Self-pay

## 2019-12-31 DIAGNOSIS — E10621 Type 1 diabetes mellitus with foot ulcer: Secondary | ICD-10-CM | POA: Diagnosis not present

## 2019-12-31 DIAGNOSIS — L97528 Non-pressure chronic ulcer of other part of left foot with other specified severity: Secondary | ICD-10-CM | POA: Diagnosis not present

## 2019-12-31 DIAGNOSIS — E11621 Type 2 diabetes mellitus with foot ulcer: Secondary | ICD-10-CM | POA: Diagnosis not present

## 2019-12-31 DIAGNOSIS — I10 Essential (primary) hypertension: Secondary | ICD-10-CM | POA: Diagnosis not present

## 2020-01-01 NOTE — Progress Notes (Addendum)
ATARI, LEISENRING ([id number]) Visit Report for 12/31/2019 Chief Complaint Document Details Patient Name: Peter Martinez, Peter L. Date of Service: 12/31/2019 9:45 AM Medical Record Number: [id number] Patient Account Number: [id number] Date of Birth/Sex: 1950-10-22 (69 y.o. M) Treating RN: Cornell Barman Primary Care Provider: Asencion Noble Other Clinician: Referring Provider: Asencion Noble Treating Provider/Extender: Tito Dine in Treatment: 0 Information Obtained from: Patient Chief Complaint 12/31/2019; patient is here out of concern for an area on his left plantar foot in the setting of type 2 diabetes Electronic Signature(s) Signed: 12/31/2019 5:06:12 PM By: Linton Ham MD Entered By: Linton Ham on 12/31/2019 10:44:34 Peter Martinez, Peter L. ([id number]) -------------------------------------------------------------------------------- HPI Details Patient Name: Chausse, Pieter L. Date of Service: 12/31/2019 9:45 AM Medical Record Number: [id number] Patient Account Number: [id number] Date of Birth/Sex: [birth year] (69 y.o. M) Treating RN: Cornell Barman Primary Care Provider: Asencion Noble Other Clinician: Referring Provider: Asencion Noble Treating Provider/Extender: Tito Dine in Treatment: 0 History of Present Illness Location: The wound is on the right plantar foot overlying the head of the first metatarsal Quality: The patient denies any type of pain at this time. Severity: At this point the severity of the wound is mild. Modifying Factors: The patient has a history of gout which causes inflammation. He also is required to wear steel toed boots at his work. Associated Signs and Symptoms: The patient denies any fevers or wound drainage. HPI Description: The patient is a 69 year old male who presents with a small wound on the plantar aspect of his right foot. It is at the head of the first metatarsal. He states that he noticed it one day after work. He does is required to wear steel toe  boots at work. He does have a significant history of gout. His diabetes is fairly well controlled. His hemoglobin A1c is less than 6.5. the patient returns for follow-up today. He denies any fevers at home. He has been compliant with his Iodoflex dressing changes. he feels like his wound is healed. READMISSION 12/31/2019 This is a now 69 year old man who is a type II diabetic. I note that he was previously here in 2015 cared for by Dr. Dwyane Dee with wounds on his right foot first metatarsal. He is here for an area on the right plantar foot. He is sent in by his primary physician Dr. Willey Blade in [place]. The story is that he noticed a blister in this area about a month ago roughly over the second metatarsal head this opened and for a while he had a wound however that he notes that this is contracted nicely. I am not completely sure what he was using on this. 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 Past medical history includes a cryptogenic stroke in January 2020 in the left posterior parietal limb area, sebaceous cyst on his scalp, prostate Khary CA treated with brachytherapy, type 2 diabetes on Metformin, hypertension, psoriasis with psoriatic arthritis ABIs in this clinic were 1.09 on the right and 1.13 on the left Electronic Signature(s) Signed: 12/31/2019 5:06:12 PM By: Linton Ham MD Entered By: Linton Ham on 12/31/2019 10:47:18 Peter Martinez, Peter Martinez ([id number]) -------------------------------------------------------------------------------- Callus Pairing Details Patient Name: Bowley, Peter L. Date of Service: 12/31/2019 9:45 AM Medical Record Number: [id number] Patient Account Number: [id number] Date of Birth/Sex: 05/30/1951 (69 y.o. M) Treating RN: Cornell Barman Primary Care Provider: Asencion Noble Other Clinician: Referring Provider: Asencion Noble Treating Provider/Extender: Tito Dine in Treatment: 0 Procedure Performed for: Non-Wound Location  Performed  By: Physician Ricard Dillon, MD Post Procedure Diagnosis Same as Pre-procedure Notes Callus pairing left plantar foot Electronic Signature(s) Signed: 12/31/2019 11:38:36 AM By: Gretta Cool, BSN, RN, CWS, Kim RN, BSN Entered By: Gretta Cool, BSN, RN, CWS, Kim on 12/31/2019 11:38:36 Peter Martinez ([id number]) -------------------------------------------------------------------------------- Physical Exam Details Patient Name: Asselin, Peter L. Date of Service: 12/31/2019 9:45 AM Medical Record Number: [id number] Patient Account Number: [id number] Date of Birth/Sex: Aug 04, 1951 (69 y.o. M) Treating RN: Cornell Barman Primary Care Provider: Asencion Noble Other Clinician: Referring Provider: Asencion Noble Treating Provider/Extender: Tito Dine in Treatment: 0 Constitutional Patient is hypertensive.. Pulse regular and within target range for patient.Marland Kitchen Respirations regular, non-labored and within target range.. Temperature is normal and within the target range for the patient.Marland Kitchen appears in no distress. Respiratory Respiratory effort is easy and symmetric bilaterally. Rate is normal at rest and on room air.. Cardiovascular Pedal pulses are palpable.. Integumentary (Hair, Skin) The patient has thick subcutaneous tissue in callus over the second metatarsal head and the left foot.. Neurological No obvious neuropathy by the microfilament test. Notes Wound exam; the area in question is actually over the left second metatarsal head. Thick callus which was dark. There is no open wound that was obvious. I took a #3 curette. The callus down. I was unable to identify an open wound area here. The original wound must have closed Electronic Signature(s) Signed: 12/31/2019 5:06:12 PM By: Linton Ham MD Entered By: Linton Ham on 12/31/2019 11:06:22 Peter Martinez, Peter Martinez ([id number]) -------------------------------------------------------------------------------- Physician Orders Details Patient Name: Karnes,  Peter L. Date of Service: 12/31/2019 9:45 AM Medical Record Number: [id number] Patient Account Number: [id number] Date of Birth/Sex: November 08, 1950 (69 y.o. M) Treating RN: Cornell Barman Primary Care Provider: Asencion Noble Other Clinician: Referring Provider: Asencion Noble Treating Provider/Extender: Tito Dine in Treatment: 0 Verbal / Phone Orders: No Diagnosis Coding Off-Loading Wound #2 Left,Plantar Foot o Other: - Keep pressure off of bottom of foot (diabetic insoles) Discharge From [facility] Wound #2 [place] o Discharge from [place] - No open wound. Keep Pressure off of area. Electronic Signature(s) Signed: 12/31/2019 5:06:12 PM By: Linton Ham MD Signed: 01/01/2020 9:48:50 AM By: Gretta Cool, BSN, RN, CWS, Kim RN, BSN Entered By: Gretta Cool, BSN, RN, CWS, Kim on 12/31/2019 10:41:05 Peter Martinez, Peter Martinez ([id number]) -------------------------------------------------------------------------------- Problem List Details Patient Name: Kovar, Teagen L. Date of Service: 12/31/2019 9:45 AM Medical Record Number: [id number] Patient Account Number: [id number] Date of Birth/Sex: [birth year] (69 y.o. M) Treating RN: Cornell Barman Primary Care Provider: Asencion Noble Other Clinician: Referring Provider: Asencion Noble Treating Provider/Extender: Tito Dine in Treatment: 0 Active Problems ICD-10 Encounter Code Description Active Date MDM Diagnosis E11.621 Type 2 diabetes mellitus with foot ulcer 12/31/2019 No Yes L97.528 Non-pressure chronic ulcer of other part of left foot with other specified 12/31/2019 No Yes severity Inactive Problems Resolved Problems Electronic Signature(s) Signed: 12/31/2019 5:06:12 PM By: Linton Ham MD Entered By: Linton Ham on 12/31/2019 10:43:26 Peter Martinez, Peter Martinez ([id number]) -------------------------------------------------------------------------------- Progress Note Details Patient Name: Midgett, Tayte L. Date of Service: 12/31/2019  9:45 AM Medical Record Number: [id number] Patient Account Number: [id number] Date of Birth/Sex: 1951/04/02 (69 y.o. M) Treating RN: Cornell Barman Primary Care Provider: Asencion Noble Other Clinician: Referring Provider: Asencion Noble Treating Provider/Extender: Tito Dine in Treatment: 0 Subjective Chief Complaint Information obtained from Patient 12/31/2019; patient is here out of concern for an area on his left plantar foot in the setting of type 2 diabetes History of  Present Illness (HPI) The following HPI elements were documented for the patient's wound: Location: The wound is on the right plantar foot overlying the head of the first metatarsal Quality: The patient denies any type of pain at this time. Severity: At this point the severity of the wound is mild. Modifying Factors: The patient has a history of gout which causes inflammation. He also is required to wear steel toed boots at his work. Associated Signs and Symptoms: The patient denies any fevers or wound drainage. The patient is a 69 year old male who presents with a small wound on the plantar aspect of his right foot. It is at the head of the first metatarsal. He states that he noticed it one day after work. He does is required to wear steel toe boots at work. He does have a significant history of gout. His diabetes is fairly well controlled. His hemoglobin A1c is less than 6.5. the patient returns for follow-up today. He denies any fevers at home. He has been compliant with his Iodoflex dressing changes. he feels like his wound is healed. READMISSION 12/31/2019 This is a now 69 year old man who is a type II diabetic. I note that he was previously here in 2015 cared for by Dr. Dwyane Dee with wounds on his right foot first metatarsal. He is here for an area on the right plantar foot. He is sent in by his primary physician Dr. Willey Blade in [place]. The story is that he noticed a blister in this area about a month ago roughly over  the second metatarsal head this opened and for a while he had a wound however that he notes that this is contracted nicely. I am not completely sure what he was using on this. 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 Allergies No Known Allergies Family History Cancer - Mother,Siblings, Diabetes - Mother,Siblings, Heart Disease - Siblings,Mother, Hypertension - Mother,Siblings, Kidney Disease - Father, Stroke - Father,Mother, Thyroid Problems - Mother, No family history of Hereditary Spherocytosis, Lung Disease, Seizures, Tuberculosis. Social History Never smoker, Marital Status - Married, Alcohol Use - Never, Drug Use - No History, Caffeine Use - Daily. Medical History Ear/Nose/Mouth/Throat Denies history of Chronic sinus problems/congestion, Middle ear problems Hematologic/Lymphatic Patient has history of Anemia - in the past Denies history of Hemophilia, Human Immunodeficiency Virus, Lymphedema, Sickle Cell Disease Respiratory Denies history of Aspiration, Asthma, Chronic Obstructive Pulmonary Disease (COPD), Pneumothorax, Sleep Apnea, Tuberculosis Cardiovascular Patient has history of Hypertension - current Denies history of Angina, Arrhythmia, Congestive Heart Failure, Coronary Artery Disease, Deep Vein Thrombosis, Hypotension, Myocardial Infarction, Peripheral Arterial Disease, Peripheral Venous Disease, Phlebitis, Vasculitis Gastrointestinal Denies history of Cirrhosis , Colitis, Crohn s, Hepatitis A, Hepatitis B, Hepatitis C Endocrine Patient has history of Type II Diabetes -  DMII Cocuzza, Peter L. ([id number]) Denies history of Type I Diabetes Genitourinary Denies history of End Stage Renal Disease Immunological Denies history of Lupus Erythematosus, Raynaud s, Scleroderma Integumentary (Skin) Denies history of History of Burn, History of pressure wounds Musculoskeletal Patient has history of Gout, Osteoarthritis Denies history of Rheumatoid Arthritis, Osteomyelitis Neurologic Patient has history of Neuropathy Denies history of Dementia, Quadriplegia,  Paraplegia, Seizure Disorder Oncologic Denies history of Received Chemotherapy, Received Radiation Psychiatric Denies history of Anorexia/bulimia, Confinement Anxiety Medical And Surgical History Notes Cardiovascular unknown cardiac implant Musculoskeletal psoriatic arthritis, psoriasis Neurologic hx CVA Oncologic prostate cancer 2017, sx 2018 with seeds implanted Review of Systems (ROS) Constitutional Symptoms (General Health) Denies complaints or symptoms of Fatigue, Fever, Chills, Marked Weight Change. Eyes Denies complaints or symptoms of Dry Eyes, Vision Changes, Glasses / Contacts. Ear/Nose/Mouth/Throat Denies complaints or symptoms of Difficult clearing ears, Sinusitis. Hematologic/Lymphatic Denies complaints or symptoms of Bleeding / Clotting Disorders, Human Immunodeficiency Virus. Respiratory Denies complaints or symptoms of Chronic or frequent coughs, Shortness of Breath. Cardiovascular Denies complaints or symptoms of Chest pain, LE edema. Gastrointestinal Denies complaints or symptoms of Frequent diarrhea, Nausea, Vomiting. Endocrine Denies complaints or symptoms of Hepatitis, Thyroid disease, Polydypsia (Excessive Thirst). Genitourinary Denies complaints or symptoms of Kidney failure/ Dialysis, Incontinence/dribbling. Immunological Denies complaints or symptoms of Hives, Itching. Integumentary (Skin) Complains or has symptoms of Wounds. Denies complaints or symptoms of Bleeding  or bruising tendency, Breakdown, Swelling. Musculoskeletal Denies complaints or symptoms of Muscle Pain, Muscle Weakness. Neurologic Denies complaints or symptoms of Numbness/parasthesias, Focal/Weakness. Psychiatric Denies complaints or symptoms of Anxiety, Claustrophobia. Objective Constitutional Patient is hypertensive.. Pulse regular and within target range for patient.Marland Kitchen Respirations regular, non-labored and within target range.. Temperature is normal and within the target range for the patient.Marland Kitchen appears in no distress. Vitals Time Taken: 10:07 AM, Height: 76 in, Source: Stated, Weight: 240 lbs, Source: Stated, BMI: 29.2, Temperature: 98.6 F, Pulse: 101 bpm, Respiratory Rate: 16 breaths/min, Blood Pressure: 150/90 mmHg. Respiratory Respiratory effort is easy and symmetric bilaterally. Rate is normal at rest and on room air.Marland Kitchen Peter Martinez, Peter Martinez ([id number]) Cardiovascular Pedal pulses are palpable.. Neurological No obvious neuropathy by the microfilament test. General Notes: Wound exam; the area in question is actually over the left second metatarsal head. Thick callus which was dark. There is no open wound that was obvious. I took a #3 curette. The callus down. I was unable to identify an open wound area here. The original wound must have closed Integumentary (Hair, Skin) The patient has thick subcutaneous tissue in callus over the second metatarsal head and the left foot.. Assessment Active Problems ICD-10 Type 2 diabetes mellitus with foot ulcer Non-pressure chronic ulcer of other part of left foot with other specified severity Procedures A Callus Pairing procedure was performed. by Ricard Dillon, MD. Post procedure Diagnosis Wound #: Same as Pre-Procedure Notes: Callus pairing left plantar foot Plan Off-Loading: Wound #2 Left,Plantar Foot: Other: - Keep pressure off of bottom of foot (diabetic insoles) Discharge From [facility] Services: Wound #2 Left,Plantar  Foot: Discharge from [place] - No open wound. Keep Pressure off of area. #1 the patient does not currently have an open wound in the involved area. Presumably this is close 2. He does however have a lot of raised callus in this area indicative of significant pressure over this part of his metatarsal area when he walks. 3. I went over this. In an ideal situation the solution of choice would be diabetic shoes with custom inserts however a lot of insurances do not cover this and out-of-pocket cost in the $5-$600 range. We went over some nonexpensive solutions to this to try and offload this area especially being he is on his feet a lot for his job. He expressed understanding. 4. Fortunately although he is a diabetic his arterial exam and ABIs in this clinic were normal. He does not  appear to have neuropathy. 5. He does not need to be followed here. Be glad to see him in the future as needs arise Electronic Signature(s) Signed: 01/16/2020 12:46:15 PM By: Gretta Cool, BSN, RN, CWS, Kim RN, BSN Signed: 02/05/2020 10:17:21 AM By: Linton Ham MD Previous Signature: 12/31/2019 5:06:12 PM Version By: Linton Ham MD Entered By: Gretta Cool, BSN, RN, CWS, Kim on 01/16/2020 12:46:15 Peter Martinez, Peter Martinez ([id number]) -------------------------------------------------------------------------------- ROS/PFSH Details Patient Name: Peter Martinez, Peter L. Date of Service: 12/31/2019 9:45 AM Medical Record Number: [id number] Patient Account Number: [id number] Date of Birth/Sex: November 18, 1950 (69 y.o. M) Treating RN: Montey Hora Primary Care Provider: Asencion Noble Other Clinician: Referring Provider: Asencion Noble Treating Provider/Extender: Tito Dine in Treatment: 0 Information Obtained From Patient Constitutional Symptoms (General Health) Complaints and Symptoms: Negative for: Fatigue; Fever; Chills; Marked Weight Change Eyes Complaints and Symptoms: Negative for: Dry Eyes; Vision Changes; Glasses /  Contacts Ear/Nose/Mouth/Throat Complaints and Symptoms: Negative for: Difficult clearing ears; Sinusitis Medical History: Negative for: Chronic sinus problems/congestion; Middle ear problems Hematologic/Lymphatic Complaints and Symptoms: Negative for: Bleeding / Clotting Disorders; Human Immunodeficiency Virus Medical History: Positive for: Anemia - in the past Negative for: Hemophilia; Human Immunodeficiency Virus; Lymphedema; Sickle Cell Disease Respiratory Complaints and Symptoms: Negative for: Chronic or frequent coughs; Shortness of Breath Medical History: Negative for: Aspiration; Asthma; Chronic Obstructive Pulmonary Disease (COPD); Pneumothorax; Sleep Apnea; Tuberculosis Cardiovascular Complaints and Symptoms: Negative for: Chest pain; LE edema Medical History: Positive for: Hypertension - current Negative for: Angina; Arrhythmia; Congestive Heart Failure; Coronary Artery Disease; Deep Vein Thrombosis; Hypotension; Myocardial Infarction; Peripheral Arterial Disease; Peripheral Venous Disease; Phlebitis; Vasculitis Past Medical History Notes: unknown cardiac implant Gastrointestinal Complaints and Symptoms: Negative for: Frequent diarrhea; Nausea; Vomiting Medical History: Negative for: Cirrhosis ; Colitis; Crohnos; Hepatitis A; Hepatitis B; Hepatitis C Endocrine Peter Martinez, Peter L. ([id number]) Complaints and Symptoms: Negative for: Hepatitis; Thyroid disease; Polydypsia (Excessive Thirst) Medical History: Positive for: Type II Diabetes - DMII Negative for: Type I Diabetes Time with diabetes: 10 years Treated with: Oral agents Blood sugar tested every day: No Genitourinary Complaints and Symptoms: Negative for: Kidney failure/ Dialysis; Incontinence/dribbling Medical History: Negative for: End Stage Renal Disease Immunological Complaints and Symptoms: Negative for: Hives; Itching Medical History: Negative for: Lupus Erythematosus; Raynaudos;  Scleroderma Integumentary (Skin) Complaints and Symptoms: Positive for: Wounds Negative for: Bleeding or bruising tendency; Breakdown; Swelling Medical History: Negative for: History of Burn; History of pressure wounds Musculoskeletal Complaints and Symptoms: Negative for: Muscle Pain; Muscle Weakness Medical History: Positive for: Gout; Osteoarthritis Negative for: Rheumatoid Arthritis; Osteomyelitis Past Medical History Notes: psoriatic arthritis, psoriasis Neurologic Complaints and Symptoms: Negative for: Numbness/parasthesias; Focal/Weakness Medical History: Positive for: Neuropathy Negative for: Dementia; Quadriplegia; Paraplegia; Seizure Disorder Past Medical History Notes: hx CVA Psychiatric Complaints and Symptoms: Negative for: Anxiety; Claustrophobia Medical History: Negative for: Anorexia/bulimia; Confinement Anxiety Oncologic Medical History: Negative for: Received Chemotherapy; Received Radiation Past Medical History Notes: prostate cancer 2017, sx 2018 with seeds implanted Peter Martinez, Shaman L. ([id number]) Immunizations Pneumococcal Vaccine: Received Pneumococcal Vaccination: Yes Immunization Notes: unknown Implantable Devices Yes Family and Social History Cancer: Yes - Mother,Siblings; Diabetes: Yes - Mother,Siblings; Heart Disease: Yes - Siblings,Mother; Hereditary Spherocytosis: No; Hypertension: Yes - Mother,Siblings; Kidney Disease: Yes - Father; Lung Disease: No; Seizures: No; Stroke: Yes - Father,Mother; Thyroid Problems: Yes - Mother; Tuberculosis: No; Never smoker; Marital Status - Married; Alcohol Use: Never; Drug Use: No History; Caffeine Use: Daily; Financial Concerns: No; Food, Clothing or Shelter Needs: No; Support System Lacking: No; Transportation Concerns: No Electronic  Signature(s) Signed: 12/31/2019 4:52:06 PM By: Montey Hora Signed: 12/31/2019 5:06:12 PM By: Linton Ham MD Entered By: Montey Hora on 12/31/2019 10:10:58 Royals,  Peter Martinez ([id number]) -------------------------------------------------------------------------------- [place] Details Patient Name: Ucci, Azavion L. Date of Service: 12/31/2019 Medical Record Number: [id number] Patient Account Number: [id number] Date of Birth/Sex: [birth year] (69 y.o. M) Treating RN: Cornell Barman Primary Care Provider: Asencion Noble Other Clinician: Referring Provider: Asencion Noble Treating Provider/Extender: Tito Dine in Treatment: 0 Diagnosis Coding ICD-10 Codes Code Description E11.621 Type 2 diabetes mellitus with foot ulcer L97.528 Non-pressure chronic ulcer of other part of left foot with other specified severity Facility Procedures CPT4 Code: [id number] Description: [facility] VISIT-LEV 3 EST PT Modifier: Quantity: 1 CPT4 Code: [id number] Description: [id number] - PARE BENIGN LES; SGL Modifier: Quantity: 1 CPT4 Code: Description: ICD-10 Diagnosis Description E11.621 Type 2 diabetes mellitus with foot ulcer Modifier: Quantity: Physician Procedures CPT4 [id number] Description: WC PHYS LEVEL 3 o NEW PT Modifier: 25 Quantity: 1 CPT4 Code: Description: ICD-10 Diagnosis Description E11.621 Type 2 diabetes mellitus with foot ulcer L97.528 Non-pressure chronic ulcer of other part of left foot with other specified Modifier: severity Quantity: CPT4 Code: [id number] Description: [id number] - WC PHYS PARE BENIGN LES; SGL Modifier: Quantity: 1 CPT4 Code: Description: ICD-10 Diagnosis Description E11.621 Type 2 diabetes mellitus with foot ulcer Modifier: Quantity: Electronic Signature(s) Signed: 12/31/2019 5:06:12 PM By: Linton Ham MD Entered By: Linton Ham on 12/31/2019 11:09:36

## 2020-01-01 NOTE — Progress Notes (Signed)
JAMESEDWARD, NEELS ([id number]) Visit Report for 12/31/2019 Allergy List Details Patient Name: Chiong, Lorry L. Date of Service: 12/31/2019 9:45 AM Medical Record Number: [id number] Patient Account Number: [id number] Date of Birth/Sex: 1951/06/22 (69 y.o. M) Treating RN: Montey Hora Primary Care Armen Waring: Asencion Noble Other Clinician: Referring Michall Noffke: Asencion Noble Treating Nabiha Planck/Extender: Ricard Dillon Weeks in Treatment: 0 Allergies Active Allergies No Known Allergies Allergy Notes Electronic Signature(s) Signed: 12/31/2019 4:52:06 PM By: Montey Hora Entered By: Montey Hora on 12/31/2019 10:08:19 Gibbon, Hobie Carlean Jews ([id number]) -------------------------------------------------------------------------------- [place] Information Details Patient Name: Maggart, Jauan L. Date of Service: 12/31/2019 9:45 AM Medical Record Number: [id number] Patient Account Number: [id number] Date of Birth/Sex: May 31, 1951 (69 y.o. M) Treating RN: Montey Hora Primary Care Loyd Salvador: Asencion Noble Other Clinician: Referring Maclovia Uher: Asencion Noble Treating Mansi Tokar/Extender: Tito Dine in Treatment: 0 Visit Information Patient Arrived: Ambulatory Arrival Time: 10:02 Accompanied By: self Transfer Assistance: None Patient Identification Verified: Yes Secondary Verification Process Completed: Yes Patient Has Alerts: Yes Patient Alerts: Patient on Blood Thinner DMII Plavix History Since Last Visit Added or deleted any medications: No Any new allergies or adverse reactions: No Had a fall or experienced change in activities of daily living that may affect risk of falls: No Signs or symptoms of abuse/neglect since last visito No Hospitalized since last visit: No Implantable device outside of the clinic excluding cellular tissue based products placed in the center since last visit: No Electronic Signature(s) Signed: 01/01/2020 9:48:50 AM By: Gretta Cool, BSN, RN, CWS, Kim RN, BSN Entered By:  Gretta Cool, BSN, RN, CWS, Kim on 12/31/2019 10:45:09 Torrens, Arelia Longest ([id number]) -------------------------------------------------------------------------------- Clinic Level of Care Assessment Details Patient Name: Kempton, Beecher L. Date of Service: 12/31/2019 9:45 AM Medical Record Number: [id number] Patient Account Number: [id number] Date of Birth/Sex: Nov 14, 1950 (69 y.o. M) Treating RN: Cornell Barman Primary Care Tejuan Gholson: Asencion Noble Other Clinician: Referring Xolani Degracia: Asencion Noble Treating Emmajo Bennette/Extender: Tito Dine in Treatment: 0 Clinic Level of Care Assessment Items TOOL 1 Quantity Score []  - Use when EandM and Procedure is performed on INITIAL visit 0 ASSESSMENTS - Nursing Assessment / Reassessment X - General Physical Exam (combine w/ comprehensive assessment (listed just below) when performed on new 1 20 pt. evals) X- 1 25 Comprehensive Assessment (HX, ROS, Risk Assessments, Wounds Hx, etc.) ASSESSMENTS - Wound and Skin Assessment / Reassessment []  - Dermatologic / Skin Assessment (not related to wound area) 0 ASSESSMENTS - Ostomy and/or Continence Assessment and Care []  - Incontinence Assessment and Management 0 []  - 0 Ostomy Care Assessment and Management (repouching, etc.) PROCESS - Coordination of Care X - Simple Patient / Family Education for ongoing care 1 15 []  - 0 Complex (extensive) Patient / Family Education for ongoing care []  - 0 Staff obtains Programmer, systems, Records, Test Results / Process Orders []  - 0 Staff telephones HHA, Nursing Homes / Clarify orders / etc []  - 0 Routine Transfer to another Facility (non-emergent condition) []  - 0 Routine Hospital Admission (non-emergent condition) X- 1 15 New Admissions / Biomedical engineer / Ordering NPWT, Apligraf, etc. []  - 0 Emergency Hospital Admission (emergent condition) PROCESS - Special Needs []  - Pediatric / Minor Patient Management 0 []  - 0 Isolation Patient Management []  - 0 Hearing /  Language / Visual special needs []  - 0 Assessment of Community assistance (transportation, D/C planning, etc.) []  - 0 Additional assistance / Altered mentation []  - 0 Support Surface(s) Assessment (bed, cushion, seat, etc.) INTERVENTIONS - Miscellaneous []  - External ear exam 0 []  -  0 Patient Transfer (multiple staff / Civil Service fast streamer / Similar devices) []  - 0 Simple Staple / Suture removal (25 or less) []  - 0 Complex Staple / Suture removal (26 or more) []  - 0 Hypo/Hyperglycemic Management (do not check if billed separately) X- 1 15 Ankle / Brachial Index (ABI) - do not check if billed separately Has the patient been seen at the hospital within the last three years: Yes Total Score: 90 Level Of Care: New/Established - Level 3 Hardrick, Jobanny L. ([id number]) Electronic Signature(s) Signed: 01/01/2020 9:48:50 AM By: Gretta Cool, BSN, RN, CWS, Kim RN, BSN Entered By: Gretta Cool, BSN, RN, CWS, Kim on 12/31/2019 10:41:35 Behrle, Arelia Longest ([id number]) -------------------------------------------------------------------------------- Encounter Discharge Information Details Patient Name: Riel, Dajon L. Date of Service: 12/31/2019 9:45 AM Medical Record Number: [id number] Patient Account Number: [id number] Date of Birth/Sex: [birth year] (69 y.o. M) Treating RN: Cornell Barman Primary Care Jennifer Payes: Asencion Noble Other Clinician: Referring Triston Lisanti: Asencion Noble Treating Makynzie Dobesh/Extender: Tito Dine in Treatment: 0 Encounter Discharge Information Items Discharge Condition: Stable Ambulatory Status: Ambulatory Discharge Destination: Home Transportation: Private Auto Accompanied By: self Schedule Follow-up Appointment: No Clinical Summary of Care: Electronic Signature(s) Signed: 01/01/2020 9:48:50 AM By: Gretta Cool, BSN, RN, CWS, Kim RN, BSN Entered By: Gretta Cool, BSN, RN, CWS, Kim on 12/31/2019 10:44:03 Boorman, Arelia Longest  ([id number]) -------------------------------------------------------------------------------- Lower Extremity Assessment Details Patient Name: Holroyd, Merdith L. Date of Service: 12/31/2019 9:45 AM Medical Record Number: [id number] Patient Account Number: [id number] Date of Birth/Sex: 25-Jul-1951 (69 y.o. M) Treating RN: Montey Hora Primary Care Janai Brannigan: Asencion Noble Other Clinician: Referring Estela Vinal: Asencion Noble Treating Hayven Croy/Extender: Tito Dine in Treatment: 0 Edema Assessment Assessed: [Left: No] [Right: No] Edema: [Left: No] [Right: No] Calf Left: Right: Point of Measurement: 34 cm From Medial Instep 37 cm 37 cm Ankle Left: Right: Point of Measurement: 12 cm From Medial Instep 25.5 cm 26.5 cm Vascular Assessment Pulses: Dorsalis Pedis Palpable: [Left:Yes] [Right:Yes] Doppler Audible: [Left:Yes] [Right:Yes] Posterior Tibial Palpable: [Left:Yes] [Right:Yes] Doppler Audible: [Left:Yes] [Right:Yes] Blood Pressure: Brachial: [Left:148] [Right:152] Dorsalis Pedis: 158 [Left:Dorsalis Pedis: [id number] Ankle: Posterior Tibial: 166 [Left:Posterior Tibial: 172 1.09] [Right:1.13] Electronic Signature(s) Signed: 12/31/2019 4:52:06 PM By: Montey Hora Entered By: Montey Hora on 12/31/2019 10:27:29 Lanpher, Koben L. ([id number]) -------------------------------------------------------------------------------- Multi Wound Chart Details Patient Name: Boissonneault, Angelica L. Date of Service: 12/31/2019 9:45 AM Medical Record Number: [id number] Patient Account Number: [id number] Date of Birth/Sex: 1951-02-09 (69 y.o. M) Treating RN: Cornell Barman Primary Care Kimblery Diop: Asencion Noble Other Clinician: Referring Perrin Eddleman: Asencion Noble Treating Aamiyah Derrick/Extender: Tito Dine in Treatment: 0 Vital Signs Height(in): 76 Pulse(bpm): 101 Weight(lbs): 240 Blood Pressure(mmHg): 150/90 Body Mass Index(BMI): 29 Temperature(F): 98.6 Respiratory Rate(breaths/min): 16 Photos:  [N/A:N/A] Wound Location: Left, Plantar Foot N/A N/A Wounding Event: Blister N/A N/A Primary Etiology: Diabetic Wound/Ulcer of the Lower N/A N/A Extremity Comorbid History: Anemia, Hypertension, Type II N/A N/A Diabetes, Gout, Osteoarthritis, Neuropathy Date Acquired: 10/27/2019 N/A N/A Weeks of Treatment: 0 N/A N/A Wound Status: Open N/A N/A Measurements L x W x D (cm) 0.3x0.2x0.1 N/A N/A Area (cm) : 0.047 N/A N/A Volume (cm) : 0.005 N/A N/A Classification: Grade 1 N/A N/A Exudate Amount: Medium N/A N/A Exudate Type: Serous N/A N/A Exudate Color: amber N/A N/A Wound Margin: Flat and Intact N/A N/A Granulation Amount: None Present (0%) N/A N/A Necrotic Amount: Large (67-100%) N/A N/A Necrotic Tissue: Eschar N/A N/A Exposed Structures: Fat Layer (Subcutaneous Tissue) N/A N/A Exposed: Yes Fascia: No Tendon: No Muscle: No Joint: No Bone: No Epithelialization:  None N/A N/A Procedures Performed: Callus Pairing N/A N/A Treatment Notes Electronic Signature(s) Signed: 12/31/2019 5:06:12 PM By: Linton Ham MD Entered By: Linton Ham on 12/31/2019 10:43:38 Bergeron, Arelia Longest ([id number]) -------------------------------------------------------------------------------- [place] Details Patient Name: Jefferys, Lyn L. Date of Service: 12/31/2019 9:45 AM Medical Record Number: [id number] Patient Account Number: [id number] Date of Birth/Sex: 1951/04/26 (69 y.o. M) Treating RN: Cornell Barman Primary Care Fabio Wah: Asencion Noble Other Clinician: Referring Ali Mclaurin: Asencion Noble Treating Shantana Christon/Extender: Tito Dine in Treatment: 0 Active Inactive Electronic Signature(s) Signed: 12/31/2019 5:16:50 PM By: Gretta Cool, BSN, RN, CWS, Kim RN, BSN Entered By: Gretta Cool, BSN, RN, CWS, Kim on 12/31/2019 17:16:49 Polo, Arelia Longest ([id number]) -------------------------------------------------------------------------------- Pain Assessment Details Patient Name: Mahadeo, Muscab  L. Date of Service: 12/31/2019 9:45 AM Medical Record Number: [id number] Patient Account Number: [id number] Date of Birth/Sex: 01/23/1951 (69 y.o. M) Treating RN: Montey Hora Primary Care Rissie Sculley: Asencion Noble Other Clinician: Referring Kassandra Meriweather: Asencion Noble Treating Chelli Yerkes/Extender: Tito Dine in Treatment: 0 Active Problems Location of Pain Severity and Description of Pain Patient Has Paino No Site Locations Pain Management and Medication Current Pain Management: Electronic Signature(s) Signed: 12/31/2019 4:52:06 PM By: Montey Hora Entered By: Montey Hora on 12/31/2019 10:07:43 Steck, Arelia Longest ([id number]) -------------------------------------------------------------------------------- Patient/Caregiver Education Details Patient Name: Durfee, Nareg L. Date of Service: 12/31/2019 9:45 AM Medical Record Number: [id number] Patient Account Number: [id number] Date of Birth/Gender: 04-Feb-1951 (69 y.o. M) Treating RN: Cornell Barman Primary Care Physician: Asencion Noble Other Clinician: Referring Physician: Asencion Noble Treating Physician/Extender: Tito Dine in Treatment: 0 Education Assessment Education Provided To: Patient Education Topics Provided Peripheral Neuropathy: Handouts: General Foot Care, Neuropathy, Other: check feet daily for open wounds Methods: Demonstration, Explain/Verbal Responses: State content correctly Pressure: Handouts: Pressure Ulcers: Care and Offloading, Other: Over the counter insoles or custom inserts for your shoes. Methods: Demonstration, Explain/Verbal Responses: State content correctly Welcome To [facility] [place]: Handouts: Welcome To [facility] [place] Methods: Demonstration, Explain/Verbal Responses: State content correctly Electronic Signature(s) Signed: 01/01/2020 9:48:50 AM By: Gretta Cool, BSN, RN, CWS, Kim RN, BSN Entered By: Gretta Cool, BSN, RN, CWS, Kim on 12/31/2019 10:42:54 Wix, Arelia Longest  ([id number]) -------------------------------------------------------------------------------- [place] Details Patient Name: Hoh, Domonik L. Date of Service: 12/31/2019 9:45 AM Medical Record Number: [id number] Patient Account Number: [id number] Date of Birth/Sex: 12-Jan-1951 (69 y.o. M) Treating RN: Montey Hora Primary Care Sylvester Salonga: Asencion Noble Other Clinician: Referring Heidemarie Goodnow: Asencion Noble Treating Janiyha Montufar/Extender: Tito Dine in Treatment: 0 Vital Signs Time Taken: 10:07 Temperature (F): 98.6 Height (in): 76 Pulse (bpm): 101 Source: Stated Respiratory Rate (breaths/min): 16 Weight (lbs): 240 Blood Pressure (mmHg): 150/90 Source: Stated Reference Range: 80 - 120 mg / dl Body Mass Index (BMI): 29.2 Electronic Signature(s) Signed: 12/31/2019 4:52:06 PM By: Montey Hora Entered By: Montey Hora on 12/31/2019 10:08:06

## 2020-01-19 DIAGNOSIS — Z79899 Other long term (current) drug therapy: Secondary | ICD-10-CM | POA: Diagnosis not present

## 2020-01-19 DIAGNOSIS — L4050 Arthropathic psoriasis, unspecified: Secondary | ICD-10-CM | POA: Diagnosis not present

## 2020-01-26 ENCOUNTER — Ambulatory Visit (INDEPENDENT_AMBULATORY_CARE_PROVIDER_SITE_OTHER): Payer: BC Managed Care – PPO | Admitting: *Deleted

## 2020-01-26 DIAGNOSIS — I63412 Cerebral infarction due to embolism of left middle cerebral artery: Secondary | ICD-10-CM | POA: Diagnosis not present

## 2020-01-26 DIAGNOSIS — E663 Overweight: Secondary | ICD-10-CM | POA: Diagnosis not present

## 2020-01-26 DIAGNOSIS — M3589 Other specified systemic involvement of connective tissue: Secondary | ICD-10-CM | POA: Diagnosis not present

## 2020-01-26 DIAGNOSIS — L4050 Arthropathic psoriasis, unspecified: Secondary | ICD-10-CM | POA: Diagnosis not present

## 2020-01-26 DIAGNOSIS — E559 Vitamin D deficiency, unspecified: Secondary | ICD-10-CM | POA: Diagnosis not present

## 2020-01-26 LAB — CUP PACEART REMOTE DEVICE CHECK
Date Time Interrogation Session: 20210614000502
Implantable Pulse Generator Implant Date: 20200206

## 2020-01-27 ENCOUNTER — Telehealth: Payer: Self-pay

## 2020-01-27 NOTE — Telephone Encounter (Signed)
Patient wife called wanting more clarification on his reading from 01/26/2020; she is concerned about the AF for 2 mins and would like a call back. Please call house #

## 2020-01-27 NOTE — Telephone Encounter (Signed)
Spoke with patient's wife, Thayer Headings ([place]), to advise that "AF" episodes reported on recent summary report are false, ECGs show SR w/ectopy. Reassured her that we will call for any actionable findings. Pt's wife verbalizes understanding and appreciation of explanation.

## 2020-01-28 NOTE — Progress Notes (Signed)
[facility] Summary Report / Loop Recorder 

## 2020-02-29 LAB — CUP PACEART REMOTE DEVICE CHECK
Date Time Interrogation Session: 20210717052521
Implantable Pulse Generator Implant Date: 20200206

## 2020-03-01 ENCOUNTER — Ambulatory Visit (INDEPENDENT_AMBULATORY_CARE_PROVIDER_SITE_OTHER): Payer: BC Managed Care – PPO | Admitting: *Deleted

## 2020-03-01 DIAGNOSIS — I63412 Cerebral infarction due to embolism of left middle cerebral artery: Secondary | ICD-10-CM

## 2020-03-01 LAB — CUP PACEART REMOTE DEVICE CHECK
Date Time Interrogation Session: 20210718232946
Implantable Pulse Generator Implant Date: 20200206

## 2020-03-02 NOTE — Progress Notes (Signed)
[facility] Summary Report / Loop Recorder 

## 2020-04-03 LAB — CUP PACEART REMOTE DEVICE CHECK
Date Time Interrogation Session: 20210819001512
Implantable Pulse Generator Implant Date: 20200206

## 2020-04-05 ENCOUNTER — Ambulatory Visit (INDEPENDENT_AMBULATORY_CARE_PROVIDER_SITE_OTHER): Payer: BC Managed Care – PPO | Admitting: *Deleted

## 2020-04-05 DIAGNOSIS — I63412 Cerebral infarction due to embolism of left middle cerebral artery: Secondary | ICD-10-CM | POA: Diagnosis not present

## 2020-04-12 NOTE — Progress Notes (Signed)
[facility] Summary Report / Loop Recorder 

## 2020-04-20 DIAGNOSIS — E1129 Type 2 diabetes mellitus with other diabetic kidney complication: Secondary | ICD-10-CM | POA: Diagnosis not present

## 2020-04-20 DIAGNOSIS — L4050 Arthropathic psoriasis, unspecified: Secondary | ICD-10-CM | POA: Diagnosis not present

## 2020-04-20 DIAGNOSIS — E785 Hyperlipidemia, unspecified: Secondary | ICD-10-CM | POA: Diagnosis not present

## 2020-04-20 DIAGNOSIS — Z79899 Other long term (current) drug therapy: Secondary | ICD-10-CM | POA: Diagnosis not present

## 2020-04-21 DIAGNOSIS — E1129 Type 2 diabetes mellitus with other diabetic kidney complication: Secondary | ICD-10-CM | POA: Diagnosis not present

## 2020-04-26 DIAGNOSIS — N1831 Chronic kidney disease, stage 3a: Secondary | ICD-10-CM | POA: Diagnosis not present

## 2020-04-26 DIAGNOSIS — M3589 Other specified systemic involvement of connective tissue: Secondary | ICD-10-CM | POA: Diagnosis not present

## 2020-04-26 DIAGNOSIS — M79671 Pain in right foot: Secondary | ICD-10-CM | POA: Diagnosis not present

## 2020-04-26 DIAGNOSIS — G8929 Other chronic pain: Secondary | ICD-10-CM | POA: Diagnosis not present

## 2020-04-26 DIAGNOSIS — Z823 Family history of stroke: Secondary | ICD-10-CM | POA: Diagnosis not present

## 2020-04-26 DIAGNOSIS — E1129 Type 2 diabetes mellitus with other diabetic kidney complication: Secondary | ICD-10-CM | POA: Diagnosis not present

## 2020-04-26 DIAGNOSIS — M79672 Pain in left foot: Secondary | ICD-10-CM | POA: Diagnosis not present

## 2020-04-26 DIAGNOSIS — R7309 Other abnormal glucose: Secondary | ICD-10-CM | POA: Diagnosis not present

## 2020-04-26 DIAGNOSIS — L4050 Arthropathic psoriasis, unspecified: Secondary | ICD-10-CM | POA: Diagnosis not present

## 2020-06-02 ENCOUNTER — Ambulatory Visit (INDEPENDENT_AMBULATORY_CARE_PROVIDER_SITE_OTHER): Payer: BC Managed Care – PPO

## 2020-06-02 DIAGNOSIS — I63412 Cerebral infarction due to embolism of left middle cerebral artery: Secondary | ICD-10-CM | POA: Diagnosis not present

## 2020-06-03 LAB — CUP PACEART REMOTE DEVICE CHECK
Date Time Interrogation Session: 20211020022300
Implantable Pulse Generator Implant Date: 20200206

## 2020-06-08 NOTE — Progress Notes (Signed)
[facility] Summary Report / Loop Recorder 

## 2020-07-05 ENCOUNTER — Ambulatory Visit (INDEPENDENT_AMBULATORY_CARE_PROVIDER_SITE_OTHER): Payer: BC Managed Care – PPO

## 2020-07-05 DIAGNOSIS — I63412 Cerebral infarction due to embolism of left middle cerebral artery: Secondary | ICD-10-CM

## 2020-07-05 LAB — CUP PACEART REMOTE DEVICE CHECK
Date Time Interrogation Session: 20211120013337
Implantable Pulse Generator Implant Date: 20200206

## 2020-07-12 NOTE — Progress Notes (Signed)
[facility] Summary Report / Loop Recorder 

## 2020-07-19 DIAGNOSIS — L4050 Arthropathic psoriasis, unspecified: Secondary | ICD-10-CM | POA: Diagnosis not present

## 2020-07-19 DIAGNOSIS — E790 Hyperuricemia without signs of inflammatory arthritis and tophaceous disease: Secondary | ICD-10-CM | POA: Diagnosis not present

## 2020-07-19 DIAGNOSIS — Z79899 Other long term (current) drug therapy: Secondary | ICD-10-CM | POA: Diagnosis not present

## 2020-07-26 DIAGNOSIS — M79641 Pain in right hand: Secondary | ICD-10-CM | POA: Diagnosis not present

## 2020-07-26 DIAGNOSIS — M79642 Pain in left hand: Secondary | ICD-10-CM | POA: Diagnosis not present

## 2020-07-26 DIAGNOSIS — M3589 Other specified systemic involvement of connective tissue: Secondary | ICD-10-CM | POA: Diagnosis not present

## 2020-07-26 DIAGNOSIS — E790 Hyperuricemia without signs of inflammatory arthritis and tophaceous disease: Secondary | ICD-10-CM | POA: Diagnosis not present

## 2020-07-26 DIAGNOSIS — L4050 Arthropathic psoriasis, unspecified: Secondary | ICD-10-CM | POA: Diagnosis not present

## 2020-07-26 DIAGNOSIS — E559 Vitamin D deficiency, unspecified: Secondary | ICD-10-CM | POA: Diagnosis not present

## 2020-08-09 ENCOUNTER — Ambulatory Visit (INDEPENDENT_AMBULATORY_CARE_PROVIDER_SITE_OTHER): Payer: BC Managed Care – PPO

## 2020-08-09 DIAGNOSIS — I63412 Cerebral infarction due to embolism of left middle cerebral artery: Secondary | ICD-10-CM

## 2020-08-10 LAB — CUP PACEART REMOTE DEVICE CHECK
Date Time Interrogation Session: 20211221013730
Implantable Pulse Generator Implant Date: 20200206

## 2020-08-20 NOTE — Progress Notes (Signed)
[facility] Summary Report / Loop Recorder 

## 2020-08-24 IMAGING — CT CT HEAD CODE STROKE
3 series · 16 of 47 positions shown, 19 images · non-contrast
Comparison: None.

CLINICAL DATA: Code stroke.  Unable to follow commands

EXAM:
CT HEAD WITHOUT CONTRAST
TECHNIQUE: Contiguous axial images were obtained from the base of the skull
through the vertex without intravenous contrast.

[Series 3: head 5.0 st · axial · 0.43mm/px · z∈[+831,+981]mm · 10 of 36 slices shown, 13 images]
[im 3/36  brain]
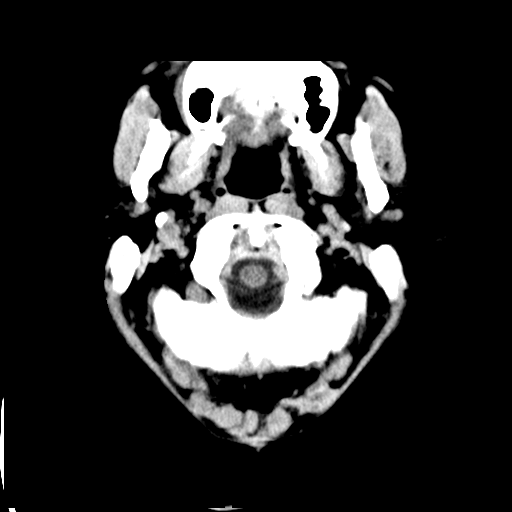
[im 3/36  bone]
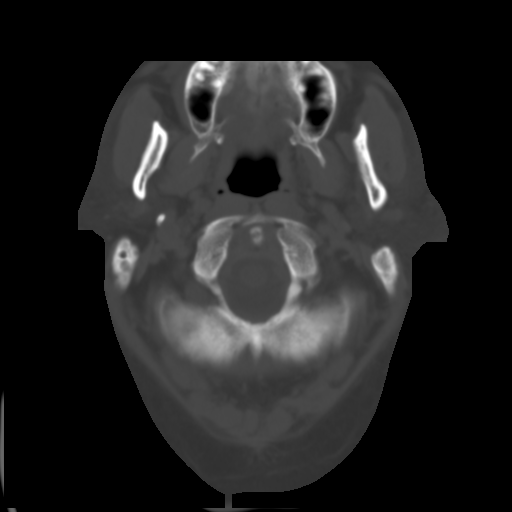
[im 7/36  brain]
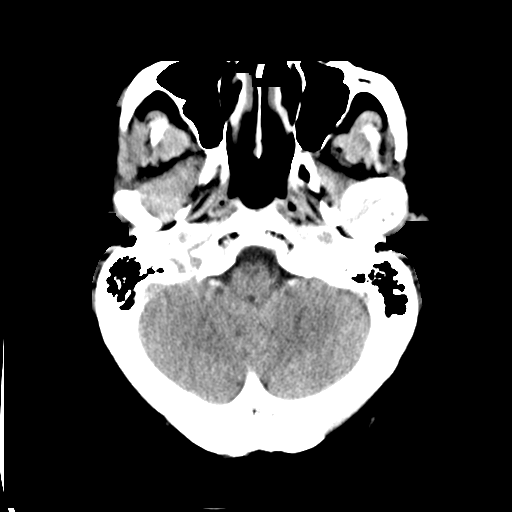
[im 10/36  brain]
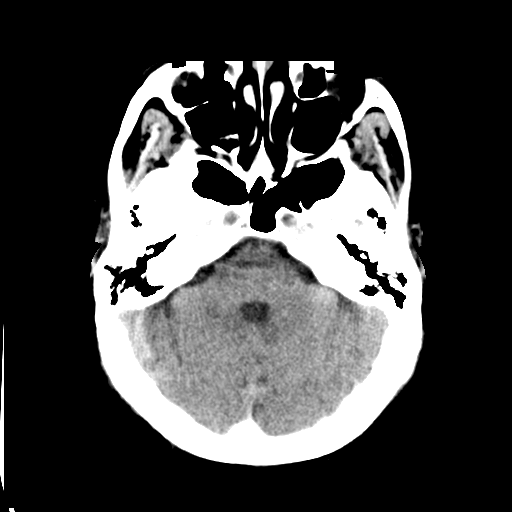
[im 13/36  brain]
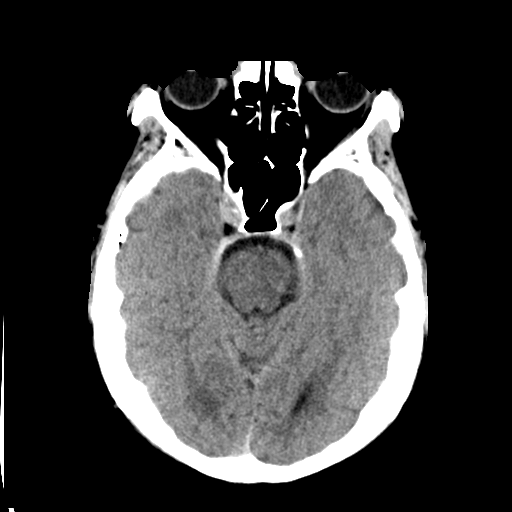
[im 16/36  brain]
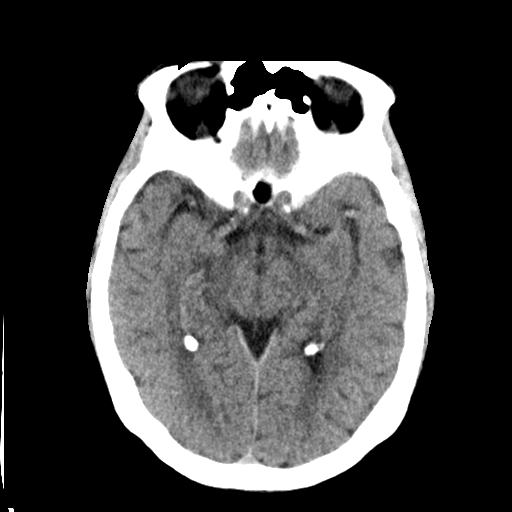
[im 16/36  bone]
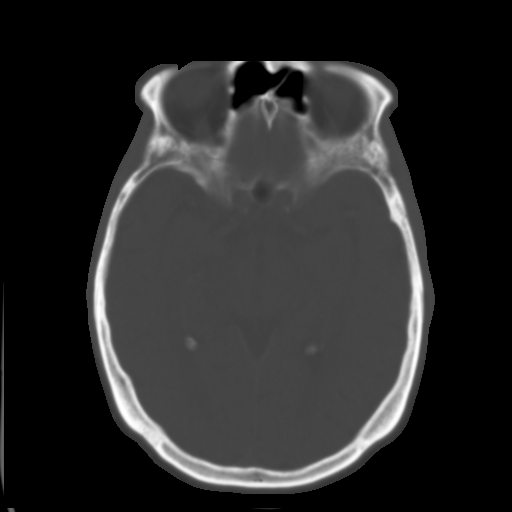
[im 20/36  brain]
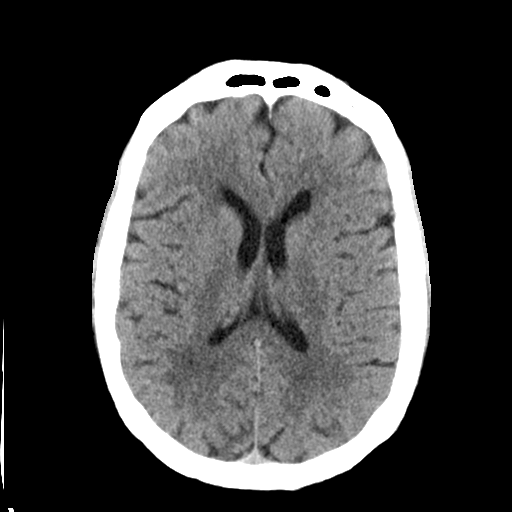
[im 23/36  brain]
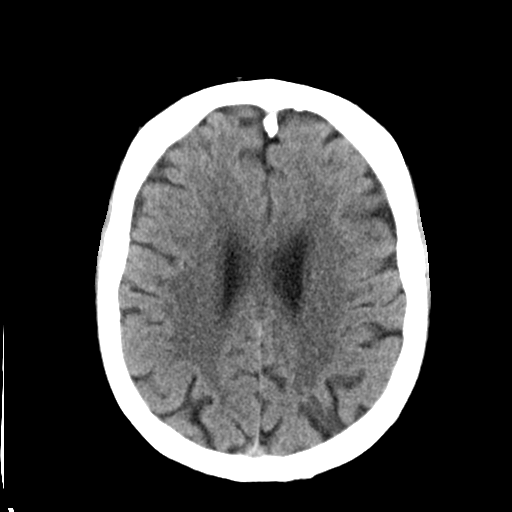
[im 27/36  brain]
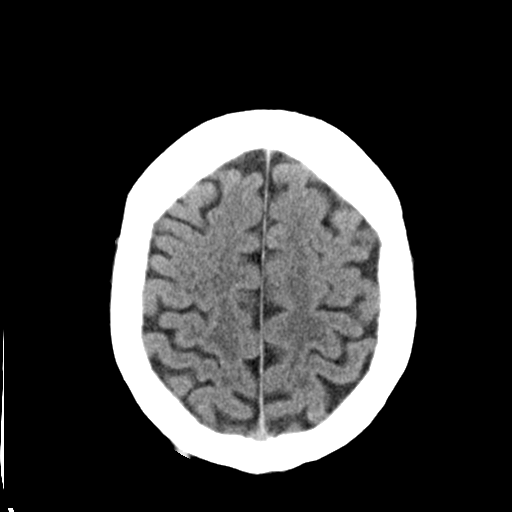
[im 29/36  brain]
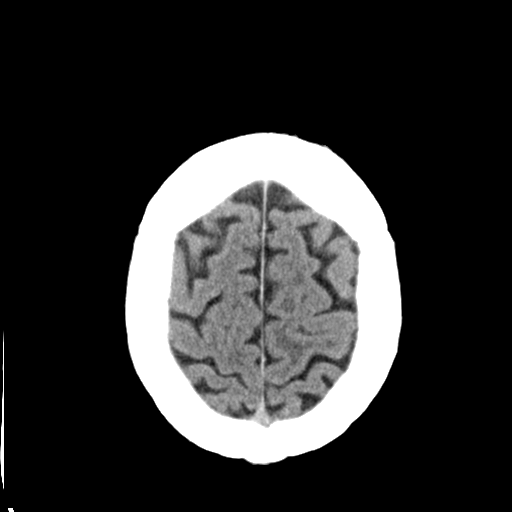
[im 29/36  bone]
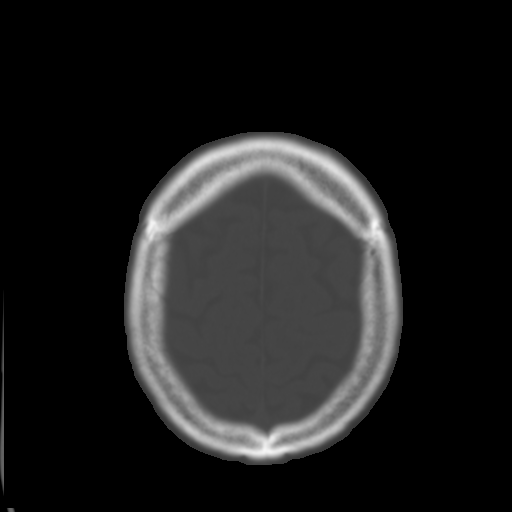
[im 33/36  brain]
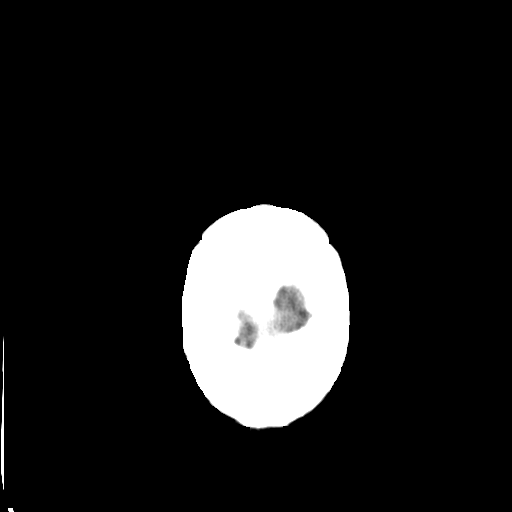

[Series 5: head 3.0 cor st · coronal · 0.35mm/px · 3 of 71 slices shown]
[im 24/71  brain]
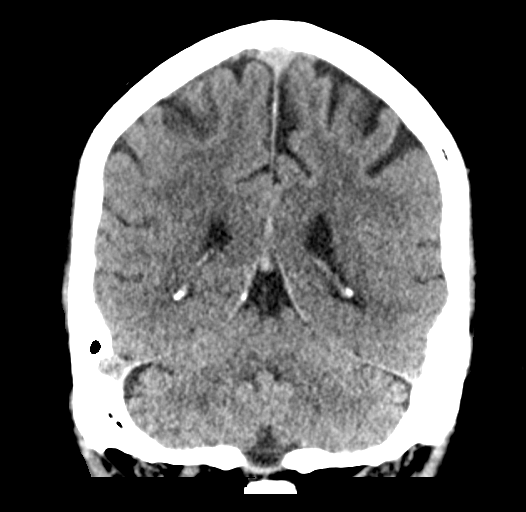
[im 32/71  brain]
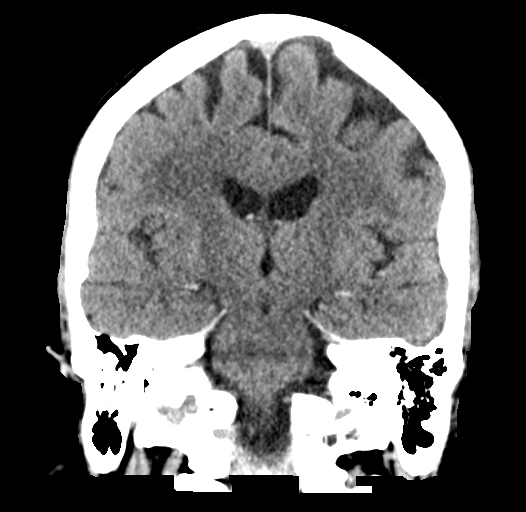
[im 39/71  brain]
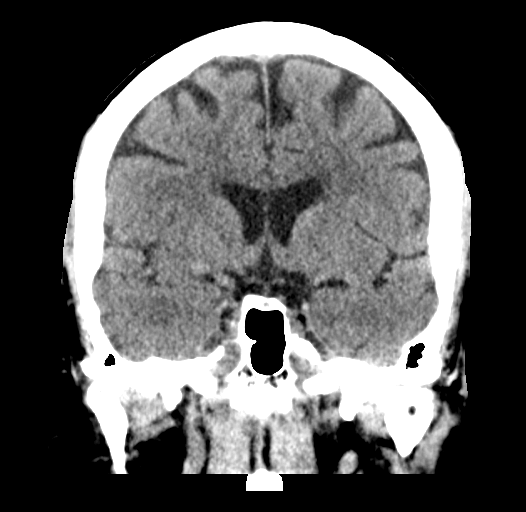

[Series 6: head 3.0 sag st · sagittal · 0.35mm/px · 3 of 60 slices shown]
[im 20/60  brain]
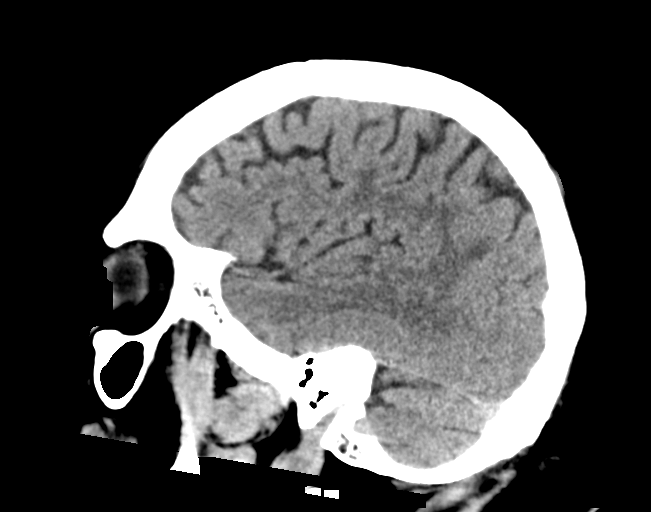
[im 30/60  brain]
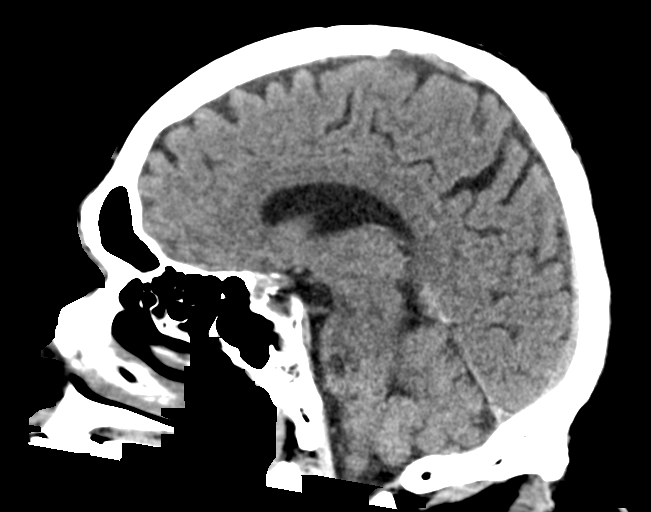
[im 40/60  brain]
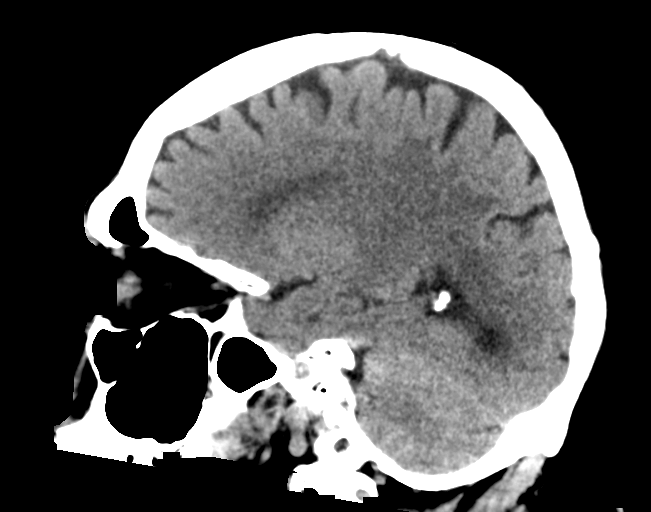

[16 of 47 positions shown; findings below may reference images not displayed]

FINDINGS: Brain: No evidence of acute infarction, hemorrhage, hydrocephalus,
extra-axial collection or mass lesion/mass effect.

Vascular: No hyperdense vessel or unexpected calcification.

Skull: Normal. Negative for fracture or focal lesion.

Sinuses/Orbits: Mild patchy mucosal thickening in the paranasal
sinuses.

Other: These results were communicated to Dr. Shake at [DATE] [place]
09/12/2018by text page via the [employer] messaging system.

Not scored with this history
IMPRESSION: Negative head CT.

## 2020-09-13 ENCOUNTER — Ambulatory Visit (INDEPENDENT_AMBULATORY_CARE_PROVIDER_SITE_OTHER): Payer: Managed Care, Other (non HMO)

## 2020-09-13 DIAGNOSIS — I63412 Cerebral infarction due to embolism of left middle cerebral artery: Secondary | ICD-10-CM

## 2020-09-13 LAB — CUP PACEART REMOTE DEVICE CHECK
Date Time Interrogation Session: 20220129231335
Implantable Pulse Generator Implant Date: 20200206

## 2020-09-22 NOTE — Progress Notes (Signed)
[facility] Summary Report / Loop Recorder 

## 2020-10-18 ENCOUNTER — Ambulatory Visit (INDEPENDENT_AMBULATORY_CARE_PROVIDER_SITE_OTHER): Payer: Managed Care, Other (non HMO)

## 2020-10-18 DIAGNOSIS — I639 Cerebral infarction, unspecified: Secondary | ICD-10-CM

## 2020-10-19 LAB — CUP PACEART REMOTE DEVICE CHECK
Date Time Interrogation Session: 20220303231933
Implantable Pulse Generator Implant Date: 20200206

## 2020-10-26 NOTE — Progress Notes (Signed)
[facility] Summary Report / Loop Recorder 

## 2020-11-17 ENCOUNTER — Ambulatory Visit (INDEPENDENT_AMBULATORY_CARE_PROVIDER_SITE_OTHER): Payer: Managed Care, Other (non HMO)

## 2020-11-17 DIAGNOSIS — I63412 Cerebral infarction due to embolism of left middle cerebral artery: Secondary | ICD-10-CM

## 2020-11-18 LAB — CUP PACEART REMOTE DEVICE CHECK
Date Time Interrogation Session: 20220406002827
Implantable Pulse Generator Implant Date: 20200206

## 2020-11-30 NOTE — Progress Notes (Signed)
[facility] Summary Report / Loop Recorder 

## 2020-12-20 ENCOUNTER — Ambulatory Visit (INDEPENDENT_AMBULATORY_CARE_PROVIDER_SITE_OTHER): Payer: Managed Care, Other (non HMO)

## 2020-12-20 DIAGNOSIS — I63412 Cerebral infarction due to embolism of left middle cerebral artery: Secondary | ICD-10-CM | POA: Diagnosis not present

## 2020-12-21 LAB — CUP PACEART REMOTE DEVICE CHECK
Date Time Interrogation Session: 20220509012109
Implantable Pulse Generator Implant Date: 20200206

## 2021-01-11 NOTE — Progress Notes (Signed)
[facility] Summary Report / Loop Recorder 

## 2021-01-21 ENCOUNTER — Telehealth: Payer: Self-pay

## 2021-01-21 NOTE — Telephone Encounter (Signed)
I called the patient to get a transmission with his home monitor. Transmission received. I let him speak with Cindy, rn.

## 2021-01-21 NOTE — Telephone Encounter (Signed)
Manual transmission received that showed episodes binned as AF that appear to be SR with ectopy, longest episode 6 minutes. Patient asymptomatic. Continue to monitor.

## 2021-01-24 ENCOUNTER — Ambulatory Visit (INDEPENDENT_AMBULATORY_CARE_PROVIDER_SITE_OTHER): Payer: Managed Care, Other (non HMO)

## 2021-01-24 DIAGNOSIS — I63412 Cerebral infarction due to embolism of left middle cerebral artery: Secondary | ICD-10-CM

## 2021-01-24 LAB — CUP PACEART REMOTE DEVICE CHECK
Date Time Interrogation Session: 20220611011842
Implantable Pulse Generator Implant Date: 20200206

## 2021-02-15 NOTE — Progress Notes (Signed)
[facility] Summary Report / Loop Recorder 

## 2021-02-24 LAB — CUP PACEART REMOTE DEVICE CHECK
Date Time Interrogation Session: 20220714012133
Implantable Pulse Generator Implant Date: 20200206

## 2021-02-28 ENCOUNTER — Ambulatory Visit (INDEPENDENT_AMBULATORY_CARE_PROVIDER_SITE_OTHER): Payer: Managed Care, Other (non HMO)

## 2021-02-28 DIAGNOSIS — I639 Cerebral infarction, unspecified: Secondary | ICD-10-CM

## 2021-03-16 ENCOUNTER — Telehealth: Payer: Self-pay

## 2021-03-16 NOTE — Telephone Encounter (Signed)
"  Link alert 2-Tachy events. No EGMs. Routing to triage for incomplete download"  Reviewed report in [facility]. Confirmed no EGMs. Unsuccessful telephone encounter to patient to request manual transmission. Hip[employer] compliant MV message left requesting call back.to [phone].

## 2021-03-17 NOTE — Telephone Encounter (Signed)
Successful telephone call to patient wife Thayer Headings (on [place]). Incomplete loop download 8/3 and no transmission 03/17/21. Wife states patient works 2 jobs and often falls asleep in his recliner and moves to bed in the early am hours. Encouraged wife to have patient sleep in room with remote monitor tonight so we can assess for AT episode ECG. Wife verbalized understanding. Will continue to follow.

## 2021-03-17 NOTE — Telephone Encounter (Signed)
Pt is returning call from yesterday while he was at work. Please contact pt's wife  on home phone [phone]

## 2021-03-22 NOTE — Progress Notes (Signed)
[facility] Summary Report / Loop Recorder 

## 2021-03-31 LAB — CUP PACEART REMOTE DEVICE CHECK
Date Time Interrogation Session: 20220816015817
Implantable Pulse Generator Implant Date: 20200206

## 2021-04-04 ENCOUNTER — Ambulatory Visit (INDEPENDENT_AMBULATORY_CARE_PROVIDER_SITE_OTHER): Payer: Managed Care, Other (non HMO)

## 2021-04-04 DIAGNOSIS — I639 Cerebral infarction, unspecified: Secondary | ICD-10-CM | POA: Diagnosis not present

## 2021-04-21 NOTE — Progress Notes (Signed)
[facility] Summary Report / Loop Recorder 

## 2021-05-04 LAB — CUP PACEART REMOTE DEVICE CHECK
Date Time Interrogation Session: 20220918015423
Implantable Pulse Generator Implant Date: 20200206

## 2021-05-09 ENCOUNTER — Ambulatory Visit (INDEPENDENT_AMBULATORY_CARE_PROVIDER_SITE_OTHER): Payer: Managed Care, Other (non HMO)

## 2021-05-09 DIAGNOSIS — I639 Cerebral infarction, unspecified: Secondary | ICD-10-CM | POA: Diagnosis not present

## 2021-05-16 NOTE — Progress Notes (Signed)
[facility] Summary Report / Loop Recorder 

## 2021-06-03 ENCOUNTER — Ambulatory Visit (INDEPENDENT_AMBULATORY_CARE_PROVIDER_SITE_OTHER): Payer: Managed Care, Other (non HMO)

## 2021-06-03 DIAGNOSIS — I639 Cerebral infarction, unspecified: Secondary | ICD-10-CM | POA: Diagnosis not present

## 2021-06-06 LAB — CUP PACEART REMOTE DEVICE CHECK
Date Time Interrogation Session: 20221021015921
Implantable Pulse Generator Implant Date: 20200206

## 2021-06-13 NOTE — Progress Notes (Signed)
[facility] Summary Report / Loop Recorder 

## 2021-07-06 ENCOUNTER — Ambulatory Visit (INDEPENDENT_AMBULATORY_CARE_PROVIDER_SITE_OTHER): Payer: Managed Care, Other (non HMO)

## 2021-07-06 DIAGNOSIS — I639 Cerebral infarction, unspecified: Secondary | ICD-10-CM | POA: Diagnosis not present

## 2021-07-06 LAB — CUP PACEART REMOTE DEVICE CHECK
Date Time Interrogation Session: 20221123010108
Implantable Pulse Generator Implant Date: 20200206

## 2021-07-15 ENCOUNTER — Other Ambulatory Visit: Payer: Self-pay

## 2021-07-15 ENCOUNTER — Emergency Department (HOSPITAL_COMMUNITY): Payer: Managed Care, Other (non HMO)

## 2021-07-15 ENCOUNTER — Encounter (HOSPITAL_COMMUNITY): Payer: Self-pay

## 2021-07-15 ENCOUNTER — Emergency Department (HOSPITAL_COMMUNITY)
Admission: EM | Admit: 2021-07-15 | Discharge: 2021-07-15 | Disposition: A | Discharge status: Home / Self Care | Payer: Managed Care, Other (non HMO) | Attending: Emergency Medicine | Admitting: Emergency Medicine

## 2021-07-15 DIAGNOSIS — E1169 Type 2 diabetes mellitus with other specified complication: Secondary | ICD-10-CM | POA: Diagnosis not present

## 2021-07-15 DIAGNOSIS — Z79899 Other long term (current) drug therapy: Secondary | ICD-10-CM | POA: Insufficient documentation

## 2021-07-15 DIAGNOSIS — Z8546 Personal history of malignant neoplasm of prostate: Secondary | ICD-10-CM | POA: Insufficient documentation

## 2021-07-15 DIAGNOSIS — J101 Influenza due to other identified influenza virus with other respiratory manifestations: Secondary | ICD-10-CM | POA: Diagnosis not present

## 2021-07-15 DIAGNOSIS — Z7984 Long term (current) use of oral hypoglycemic drugs: Secondary | ICD-10-CM | POA: Insufficient documentation

## 2021-07-15 DIAGNOSIS — Z7902 Long term (current) use of antithrombotics/antiplatelets: Secondary | ICD-10-CM | POA: Diagnosis not present

## 2021-07-15 DIAGNOSIS — I10 Essential (primary) hypertension: Secondary | ICD-10-CM | POA: Insufficient documentation

## 2021-07-15 DIAGNOSIS — Z20822 Contact with and (suspected) exposure to covid-19: Secondary | ICD-10-CM | POA: Insufficient documentation

## 2021-07-15 DIAGNOSIS — R531 Weakness: Secondary | ICD-10-CM | POA: Diagnosis not present

## 2021-07-15 DIAGNOSIS — R5383 Other fatigue: Secondary | ICD-10-CM | POA: Diagnosis present

## 2021-07-15 LAB — RESP PANEL BY RT-PCR (FLU A&B, COVID) ARPGX2
Influenza A by PCR: POSITIVE — AB
Influenza B by PCR: NEGATIVE
SARS Coronavirus 2 by RT PCR: NEGATIVE

## 2021-07-15 MED ORDER — ACETAMINOPHEN 325 MG PO TABS
650.0000 mg | ORAL_TABLET | Freq: Once | ORAL | Status: AC
  Administered 2021-07-15: 650 mg via ORAL
  Filled 2021-07-15: qty 2

## 2021-07-15 MED ORDER — OSELTAMIVIR PHOSPHATE 75 MG PO CAPS: 75.0000 mg | ORAL_CAPSULE | Freq: Two times a day (BID) | ORAL | 0 refills | Status: AC

## 2021-07-15 NOTE — ED Provider Notes (Signed)
[facility] EMERGENCY DEPARTMENT Provider Note   CSN: [id number] Arrival date & time: 07/15/21  1757     History Chief Complaint  Patient presents with   Weakness    Peter Martinez is a 70 y.o. male who presents the emergency department with fatigue and generalized weakness that began last night.  Wife states that the patient works 2 jobs and is typically tired but he has been more fatigued than usual.  She also states that he has been acting slightly abnormal and just a little more confused than normal.  Patient is also been coughing since last night and had an increased work of breathing when he got home from work today.  Patient arrived via EMS secondary to his generalized weakness.  Patient had a fever around 101 via EMS.  Tylenol was given prior to arrival.  Patient denies any chest pain, abdominal pain, nausea, vomiting, diarrhea, urinary complaints.   Weakness Associated symptoms: no dysuria, no frequency and no urgency       Past Medical History:  Diagnosis Date   Anemia    Arthritis    psoriatic   Cancer (HCC)    prostate   Diabetes mellitus    Gout    H/O mixed connective tissue disease    History of kidney stones    Hypertension    Kidney stones    Psoriasis    Wears glasses    Wears partial dentures    upper    Patient Active Problem List   Diagnosis Date Noted   Seizure ([place]) 09/12/2018   CVA (cerebral vascular accident) ([place]) 09/12/2018   Essential hypertension 09/12/2018   Diabetes mellitus type 2 in nonobese ([place]) 09/12/2018   Cerebral infarction due to embolism of left middle cerebral artery (HCC)    Sebaceous cyst    Malignant neoplasm of prostate ([place]) 08/25/2016    Past Surgical History:  Procedure Laterality Date   LOOP RECORDER INSERTION N/A 09/19/2018   Procedure: LOOP RECORDER INSERTION;  Surgeon: Thompson Grayer, MD;  Location: [place] [facility];  Service: Cardiovascular;  Laterality: N/A;   LOOP RECORDER INSERTION  09/19/2018   Procedure:  LOOP RECORDER INSERTION;  Surgeon: Lelon Perla, MD;  Location: [place];  Service: Cardiovascular;;   MASS EXCISION N/A 05/11/2017   Procedure: EXCISION OF CYST ON SCALP;  Surgeon: Aviva Signs, MD;  Location: [facility];  Service: General;  Laterality: N/A;   RADIOACTIVE SEED IMPLANT N/A 10/27/2016   Procedure: RADIOACTIVE SEED IMPLANT/BRACHYTHERAPY IMPLANT;  Surgeon: Alexis Frock, MD;  [facility];  Service: Urology;  Laterality: N/A;   TEE WITHOUT CARDIOVERSION N/A 09/19/2018   Procedure: TRANSESOPHAGEAL ECHOCARDIOGRAM (TEE);  Surgeon: Lelon Perla, MD;  Location: [facility] ENDOSCOPY;  Service: Cardiovascular;  Laterality: N/A;   TONSILLECTOMY         Family History  Problem Relation Age of Onset   Breast cancer Mother    Dementia Mother 51   Stroke Father 18   Leukemia Father    Stroke Brother    CAD Brother 37    Social History   Tobacco Use   Smoking status: Never   Smokeless tobacco: Never  Vaping Use   Vaping Use: Never used  Substance Use Topics   Alcohol use: No   Drug use: No    Home Medications Prior to Admission medications   Medication Sig Start Date End Date Taking? Authorizing Provider  acetaminophen (TYLENOL) 500 MG tablet Take 1,000 mg by mouth as needed.  Yes [provider]  allopurinol (ZYLOPRIM) 300 MG tablet Take 300 mg by mouth daily.   Yes [provider]  cholecalciferol (VITAMIN D) 1000 units tablet Take 5,000 Units by mouth at bedtime.   Yes [provider]  clopidogrel (PLAVIX) 75 MG tablet TAKE 1 TABLET BY MOUTH ONCE DAILY. Patient taking differently: Take 75 mg by mouth 2 (two) times daily. 05/19/19  Yes McCue, Janett Billow, NP  colchicine 0.6 MG tablet One tab po q 2 hrs until pain relief or stomach upset Patient taking differently: Take 0.6 mg by mouth as needed. One tab po q 2 hrs until pain relief or stomach upset 02/29/12  Yes Triplett, Tammy, PA-C  Cyanocobalamin (B-12) 2500 MCG TABS Take  2,500 mcg by mouth daily.   Yes [provider]  folic acid (FOLVITE) 1 MG tablet Take 1 mg by mouth daily.   Yes [provider]  hydroxychloroquine (PLAQUENIL) 200 MG tablet Take 200 mg by mouth 2 (two) times daily.   Yes [provider]  levETIRAcetam (KEPPRA) 500 MG tablet Take 1 tablet (500 mg total) by mouth 2 (two) times daily. 09/13/18  Yes Danford, Suann Larry, MD  metFORMIN (GLUCOPHAGE) 1000 MG tablet Take 1,000 mg by mouth 2 (two) times daily with a meal.   Yes [provider]  methotrexate (RHEUMATREX) 2.5 MG tablet Take 20 mg by mouth every Friday. On Fridays  8 tablets   Yes [provider]  metoprolol tartrate (LOPRESSOR) 50 MG tablet Take 50 mg by mouth 2 (two) times daily.    Yes [provider]  oseltamivir (TAMIFLU) 75 MG capsule Take 1 capsule (75 mg total) by mouth every 12 (twelve) hours. 07/15/21  Yes Raul Del, Milana Salay M, PA-C  simvastatin (ZOCOR) 20 MG tablet Take 1 tablet (20 mg total) by mouth at bedtime. 09/13/18  Yes Danford, Suann Larry, MD    Allergies    Patient has no known allergies.  Review of Systems   Review of Systems  Genitourinary:  Negative for dysuria, frequency and urgency.  Neurological:  Positive for weakness.  All other systems reviewed and are negative.  Physical Exam Updated Vital Signs BP 123/73   Pulse 80   Temp 99.7 F (37.6 C) (Oral)   Resp (!) 24   Ht 6\' 4"  (1.93 m)   Wt 104.3 kg   SpO2 95%   BMI 28.00 kg/m   Physical Exam  ED Results / Procedures / Treatments   Labs (all labs ordered are listed, but only abnormal results are displayed) Labs Reviewed  RESP PANEL BY RT-PCR (FLU A&B, COVID) ARPGX2 - Abnormal; Notable for the following components:      Result Value   Influenza A by PCR POSITIVE (*)    All other components within normal limits    EKG None  Radiology DG Chest Port 1 View  Result Date: 07/15/2021 CLINICAL DATA:  Cough and shortness of breath. Influenza  A positive today. EXAM: PORTABLE CHEST 1 VIEW COMPARISON:  09/01/2016 FINDINGS: Loop recorder noted. There is potentially mild subsegmental atelectasis along the left lateral hemidiaphragm. Otherwise the lungs appear clear. Heart size within normal limits for projection. No blunting of the costophrenic angles. IMPRESSION: 1. There is potentially subsegmental atelectasis along the left hemidiaphragm. The lungs appear otherwise clear. 2. Loop recorder noted. Electronically Signed   By: Van Clines M.D.   On: 07/15/2021 21:14    Procedures Procedures   Medications Ordered in ED Medications  acetaminophen (TYLENOL) tablet 650 mg (  650 mg Oral Given 07/15/21 1843)    ED Course  I have reviewed the triage vital signs and the nursing notes.  Pertinent labs & imaging results that were available during my care of the patient were reviewed by me and considered in my medical decision making (see chart for details).    MDM Rules/Calculators/A&P                          Vinicio Lynk Fox is a 70 y.o. male who presents the emergency department with generalized weakness and fatigue.  Respiratory swab was obtained which is positive for influenza.  This is likely the cause of his symptoms.  Given his cough and shortness of breath will obtain a chest x-ray to evaluate for possible pneumonia.  Offered patient fluids but patient declined.  Some symptoms started yesterday patient will likely be able to go home with a prescription for Tamiflu.  Chest x-ray was negative for any overlying pneumonia.  Given the clinical scenario, he is safe for discharge.  All questions or concerns addressed.  He is stable and safe for discharge.   Final Clinical Impression(s) / ED Diagnoses Final diagnoses:  Influenza A    Rx / DC Orders ED Discharge Orders          Ordered    oseltamivir (TAMIFLU) 75 MG capsule  Every 12 hours        07/15/21 2146             Hendricks Limes, PA-C 07/15/21 2148    Daleen Bo, MD 07/16/21 1436

## 2021-07-15 NOTE — ED Provider Notes (Signed)
  Face-to-face evaluation   History: He presents for evaluation of malaise, weakness and fall.  Today after he came home from work he was confused.  He has had some sniffling and sneezing for couple days.  He had an flu immunization, 3 weeks ago.  No other known sick contacts.  He denies chest pain, shortness of breath, focal weakness or paresthesia.  Physical exam: Alert elderly male.  He is lucid.  No respiratory distress.  No dysarthria or aphasia  Medical screening examination/treatment/procedure(s) were conducted as a shared visit with non-physician practitioner(s) and myself.  I personally evaluated the patient during the encounter    Daleen Bo, MD 07/16/21 1436

## 2021-07-15 NOTE — Progress Notes (Signed)
[facility] Summary Report / Loop Recorder 

## 2021-07-15 NOTE — ED Triage Notes (Signed)
Pt presents to ED via [facility] for generalized weakness started today. Pt had 2 falls today, one at work and one in the shower. BP 151/93, T 99.6, CBG 161, 20G IV R FA.

## 2021-07-15 NOTE — Discharge Instructions (Addendum)
You tested positive for influenza today.  This is likely the cause of your excess fatigue and generalized weakness.  This is a virus and will get better on its own.  I have prescribed you Tamiflu which she will take twice daily for the next 5 days.  This will shorten the course of your illness.  Please drink plenty of fluids and get plenty of rest.  Tylenol for pain and aches and fever.  Please follow-up with your primary care provider to ensure we are going the right direction.  Please return to the emergency department sooner if you experience worsening symptoms like severe cough, trouble breathing, worsening weakness, intractable vomiting/diarrhea, worsening fevers, or any other concerns you might have.

## 2021-08-01 ENCOUNTER — Telehealth: Payer: Self-pay | Admitting: Internal Medicine

## 2021-08-01 NOTE — Telephone Encounter (Signed)
Returned patients phone call.   Patient does not want to continue remote monitoring due to monthly cost of $235. Offered quarterly monitoring and he is unable to pay. Canceled remotes. Patient would like to leave device in. Advised patient to call if any questions or concerns shall arise.

## 2021-08-01 NOTE — Telephone Encounter (Signed)
°  1. Has your device fired?  No  2. Is you device beeping?  No   3. Are you experiencing draining or swelling at device site?  No   4. Are you calling to see if we received your device transmission?  No   5. Have you passed out?  No  Patient is not having any issues. He received his letter stating Dr. Rayann Heman is leaving the practice and states he would like to discontinue service completely. However, he states he would like to keep ILR in if possible. Contacted device clinic, but RN was unavailable for transfer. Advised patient his call will be returned soon.

## 2021-08-01 NOTE — Telephone Encounter (Signed)
Patient states he want to stop his electronic services. Please advise

## 2021-08-08 LAB — CUP PACEART REMOTE DEVICE CHECK
Date Time Interrogation Session: 20221226010655
Implantable Pulse Generator Implant Date: 20200206

## 2021-08-10 ENCOUNTER — Ambulatory Visit (INDEPENDENT_AMBULATORY_CARE_PROVIDER_SITE_OTHER): Payer: Managed Care, Other (non HMO)

## 2021-08-10 DIAGNOSIS — I639 Cerebral infarction, unspecified: Secondary | ICD-10-CM

## 2021-08-23 NOTE — Progress Notes (Signed)
[facility] Summary Report / Loop Recorder 

## 2022-08-21 DIAGNOSIS — Z79899 Other long term (current) drug therapy: Secondary | ICD-10-CM | POA: Diagnosis not present

## 2022-08-21 DIAGNOSIS — E1140 Type 2 diabetes mellitus with diabetic neuropathy, unspecified: Secondary | ICD-10-CM | POA: Diagnosis not present

## 2022-08-21 DIAGNOSIS — G40509 Epileptic seizures related to external causes, not intractable, without status epilepticus: Secondary | ICD-10-CM | POA: Diagnosis not present

## 2022-08-21 DIAGNOSIS — L4050 Arthropathic psoriasis, unspecified: Secondary | ICD-10-CM | POA: Diagnosis not present

## 2022-08-28 DIAGNOSIS — Z789 Other specified health status: Secondary | ICD-10-CM | POA: Diagnosis not present

## 2022-08-28 DIAGNOSIS — Z13820 Encounter for screening for osteoporosis: Secondary | ICD-10-CM | POA: Diagnosis not present

## 2022-08-28 DIAGNOSIS — R7989 Other specified abnormal findings of blood chemistry: Secondary | ICD-10-CM | POA: Diagnosis not present

## 2022-08-28 DIAGNOSIS — R7309 Other abnormal glucose: Secondary | ICD-10-CM | POA: Diagnosis not present

## 2022-08-28 DIAGNOSIS — L4050 Arthropathic psoriasis, unspecified: Secondary | ICD-10-CM | POA: Diagnosis not present

## 2022-08-28 DIAGNOSIS — D649 Anemia, unspecified: Secondary | ICD-10-CM | POA: Diagnosis not present

## 2022-08-28 DIAGNOSIS — E790 Hyperuricemia without signs of inflammatory arthritis and tophaceous disease: Secondary | ICD-10-CM | POA: Diagnosis not present

## 2022-08-28 DIAGNOSIS — Z2889 Immunization not carried out for other reason: Secondary | ICD-10-CM | POA: Diagnosis not present

## 2022-08-28 DIAGNOSIS — E559 Vitamin D deficiency, unspecified: Secondary | ICD-10-CM | POA: Diagnosis not present

## 2022-08-28 DIAGNOSIS — G4089 Other seizures: Secondary | ICD-10-CM | POA: Diagnosis not present

## 2022-08-28 DIAGNOSIS — E663 Overweight: Secondary | ICD-10-CM | POA: Diagnosis not present

## 2022-08-28 DIAGNOSIS — Z79899 Other long term (current) drug therapy: Secondary | ICD-10-CM | POA: Diagnosis not present

## 2022-08-28 DIAGNOSIS — M3589 Other specified systemic involvement of connective tissue: Secondary | ICD-10-CM | POA: Diagnosis not present

## 2022-08-28 DIAGNOSIS — L409 Psoriasis, unspecified: Secondary | ICD-10-CM | POA: Diagnosis not present

## 2022-08-28 DIAGNOSIS — I10 Essential (primary) hypertension: Secondary | ICD-10-CM | POA: Diagnosis not present

## 2022-08-28 DIAGNOSIS — E1140 Type 2 diabetes mellitus with diabetic neuropathy, unspecified: Secondary | ICD-10-CM | POA: Diagnosis not present

## 2022-10-30 DIAGNOSIS — H524 Presbyopia: Secondary | ICD-10-CM | POA: Diagnosis not present

## 2022-11-13 DIAGNOSIS — Z79899 Other long term (current) drug therapy: Secondary | ICD-10-CM | POA: Diagnosis not present

## 2022-11-13 DIAGNOSIS — E790 Hyperuricemia without signs of inflammatory arthritis and tophaceous disease: Secondary | ICD-10-CM | POA: Diagnosis not present

## 2022-11-13 DIAGNOSIS — L4050 Arthropathic psoriasis, unspecified: Secondary | ICD-10-CM | POA: Diagnosis not present

## 2022-11-13 DIAGNOSIS — E559 Vitamin D deficiency, unspecified: Secondary | ICD-10-CM | POA: Diagnosis not present

## 2022-11-20 DIAGNOSIS — Z2889 Immunization not carried out for other reason: Secondary | ICD-10-CM | POA: Diagnosis not present

## 2022-11-20 DIAGNOSIS — Z13820 Encounter for screening for osteoporosis: Secondary | ICD-10-CM | POA: Diagnosis not present

## 2022-11-20 DIAGNOSIS — E790 Hyperuricemia without signs of inflammatory arthritis and tophaceous disease: Secondary | ICD-10-CM | POA: Diagnosis not present

## 2022-11-20 DIAGNOSIS — E559 Vitamin D deficiency, unspecified: Secondary | ICD-10-CM | POA: Diagnosis not present

## 2022-11-20 DIAGNOSIS — L409 Psoriasis, unspecified: Secondary | ICD-10-CM | POA: Diagnosis not present

## 2022-11-20 DIAGNOSIS — D649 Anemia, unspecified: Secondary | ICD-10-CM | POA: Diagnosis not present

## 2022-11-20 DIAGNOSIS — L4050 Arthropathic psoriasis, unspecified: Secondary | ICD-10-CM | POA: Diagnosis not present

## 2022-11-20 DIAGNOSIS — M3589 Other specified systemic involvement of connective tissue: Secondary | ICD-10-CM | POA: Diagnosis not present

## 2022-11-20 DIAGNOSIS — Z789 Other specified health status: Secondary | ICD-10-CM | POA: Diagnosis not present

## 2022-11-20 DIAGNOSIS — M79641 Pain in right hand: Secondary | ICD-10-CM | POA: Diagnosis not present

## 2022-11-20 DIAGNOSIS — R7989 Other specified abnormal findings of blood chemistry: Secondary | ICD-10-CM | POA: Diagnosis not present

## 2022-11-20 DIAGNOSIS — M79642 Pain in left hand: Secondary | ICD-10-CM | POA: Diagnosis not present

## 2022-12-25 DIAGNOSIS — Z79899 Other long term (current) drug therapy: Secondary | ICD-10-CM | POA: Diagnosis not present

## 2023-01-09 DIAGNOSIS — I10 Essential (primary) hypertension: Secondary | ICD-10-CM | POA: Diagnosis not present

## 2023-01-09 DIAGNOSIS — Z79899 Other long term (current) drug therapy: Secondary | ICD-10-CM | POA: Diagnosis not present

## 2023-01-09 DIAGNOSIS — G40509 Epileptic seizures related to external causes, not intractable, without status epilepticus: Secondary | ICD-10-CM | POA: Diagnosis not present

## 2023-01-09 DIAGNOSIS — E1140 Type 2 diabetes mellitus with diabetic neuropathy, unspecified: Secondary | ICD-10-CM | POA: Diagnosis not present

## 2023-01-09 DIAGNOSIS — E785 Hyperlipidemia, unspecified: Secondary | ICD-10-CM | POA: Diagnosis not present

## 2023-01-15 DIAGNOSIS — E1140 Type 2 diabetes mellitus with diabetic neuropathy, unspecified: Secondary | ICD-10-CM | POA: Diagnosis not present

## 2023-01-15 DIAGNOSIS — E785 Hyperlipidemia, unspecified: Secondary | ICD-10-CM | POA: Diagnosis not present

## 2023-01-15 DIAGNOSIS — R739 Hyperglycemia, unspecified: Secondary | ICD-10-CM | POA: Diagnosis not present

## 2023-02-12 DIAGNOSIS — L4050 Arthropathic psoriasis, unspecified: Secondary | ICD-10-CM | POA: Diagnosis not present

## 2023-02-12 DIAGNOSIS — Z79899 Other long term (current) drug therapy: Secondary | ICD-10-CM | POA: Diagnosis not present

## 2023-02-19 DIAGNOSIS — L4050 Arthropathic psoriasis, unspecified: Secondary | ICD-10-CM | POA: Diagnosis not present

## 2023-02-19 DIAGNOSIS — Z2889 Immunization not carried out for other reason: Secondary | ICD-10-CM | POA: Diagnosis not present

## 2023-02-19 DIAGNOSIS — E790 Hyperuricemia without signs of inflammatory arthritis and tophaceous disease: Secondary | ICD-10-CM | POA: Diagnosis not present

## 2023-02-19 DIAGNOSIS — E663 Overweight: Secondary | ICD-10-CM | POA: Diagnosis not present

## 2023-02-19 DIAGNOSIS — D649 Anemia, unspecified: Secondary | ICD-10-CM | POA: Diagnosis not present

## 2023-02-19 DIAGNOSIS — Z789 Other specified health status: Secondary | ICD-10-CM | POA: Diagnosis not present

## 2023-02-19 DIAGNOSIS — Z13820 Encounter for screening for osteoporosis: Secondary | ICD-10-CM | POA: Diagnosis not present

## 2023-02-19 DIAGNOSIS — M3589 Other specified systemic involvement of connective tissue: Secondary | ICD-10-CM | POA: Diagnosis not present

## 2023-02-19 DIAGNOSIS — L409 Psoriasis, unspecified: Secondary | ICD-10-CM | POA: Diagnosis not present

## 2023-02-19 DIAGNOSIS — R7989 Other specified abnormal findings of blood chemistry: Secondary | ICD-10-CM | POA: Diagnosis not present

## 2023-02-21 DIAGNOSIS — Z0100 Encounter for examination of eyes and vision without abnormal findings: Secondary | ICD-10-CM | POA: Diagnosis not present

## 2023-05-22 DIAGNOSIS — G4089 Other seizures: Secondary | ICD-10-CM | POA: Diagnosis not present

## 2023-05-22 DIAGNOSIS — Z79899 Other long term (current) drug therapy: Secondary | ICD-10-CM | POA: Diagnosis not present

## 2023-05-22 DIAGNOSIS — E1140 Type 2 diabetes mellitus with diabetic neuropathy, unspecified: Secondary | ICD-10-CM | POA: Diagnosis not present

## 2023-05-22 DIAGNOSIS — L4050 Arthropathic psoriasis, unspecified: Secondary | ICD-10-CM | POA: Diagnosis not present

## 2023-05-22 DIAGNOSIS — I10 Essential (primary) hypertension: Secondary | ICD-10-CM | POA: Diagnosis not present

## 2023-05-22 DIAGNOSIS — E785 Hyperlipidemia, unspecified: Secondary | ICD-10-CM | POA: Diagnosis not present

## 2023-05-22 DIAGNOSIS — Z125 Encounter for screening for malignant neoplasm of prostate: Secondary | ICD-10-CM | POA: Diagnosis not present

## 2023-05-28 DIAGNOSIS — M79671 Pain in right foot: Secondary | ICD-10-CM | POA: Diagnosis not present

## 2023-05-28 DIAGNOSIS — D649 Anemia, unspecified: Secondary | ICD-10-CM | POA: Diagnosis not present

## 2023-05-28 DIAGNOSIS — G8929 Other chronic pain: Secondary | ICD-10-CM | POA: Diagnosis not present

## 2023-05-28 DIAGNOSIS — L409 Psoriasis, unspecified: Secondary | ICD-10-CM | POA: Diagnosis not present

## 2023-05-28 DIAGNOSIS — Z2889 Immunization not carried out for other reason: Secondary | ICD-10-CM | POA: Diagnosis not present

## 2023-05-28 DIAGNOSIS — Z789 Other specified health status: Secondary | ICD-10-CM | POA: Diagnosis not present

## 2023-05-28 DIAGNOSIS — E790 Hyperuricemia without signs of inflammatory arthritis and tophaceous disease: Secondary | ICD-10-CM | POA: Diagnosis not present

## 2023-05-28 DIAGNOSIS — R7989 Other specified abnormal findings of blood chemistry: Secondary | ICD-10-CM | POA: Diagnosis not present

## 2023-05-28 DIAGNOSIS — M3589 Other specified systemic involvement of connective tissue: Secondary | ICD-10-CM | POA: Diagnosis not present

## 2023-05-28 DIAGNOSIS — Z13820 Encounter for screening for osteoporosis: Secondary | ICD-10-CM | POA: Diagnosis not present

## 2023-05-28 DIAGNOSIS — M79672 Pain in left foot: Secondary | ICD-10-CM | POA: Diagnosis not present

## 2023-05-28 DIAGNOSIS — L4050 Arthropathic psoriasis, unspecified: Secondary | ICD-10-CM | POA: Diagnosis not present

## 2023-06-27 IMAGING — DX DG CHEST 1V PORT
1 series · 1 of 1 positions shown · non-contrast
Comparison: 09/01/2016

CLINICAL DATA: Cough and shortness of breath. Influenza A positive
today.

EXAM:
PORTABLE CHEST 1 VIEW

[chest ap]
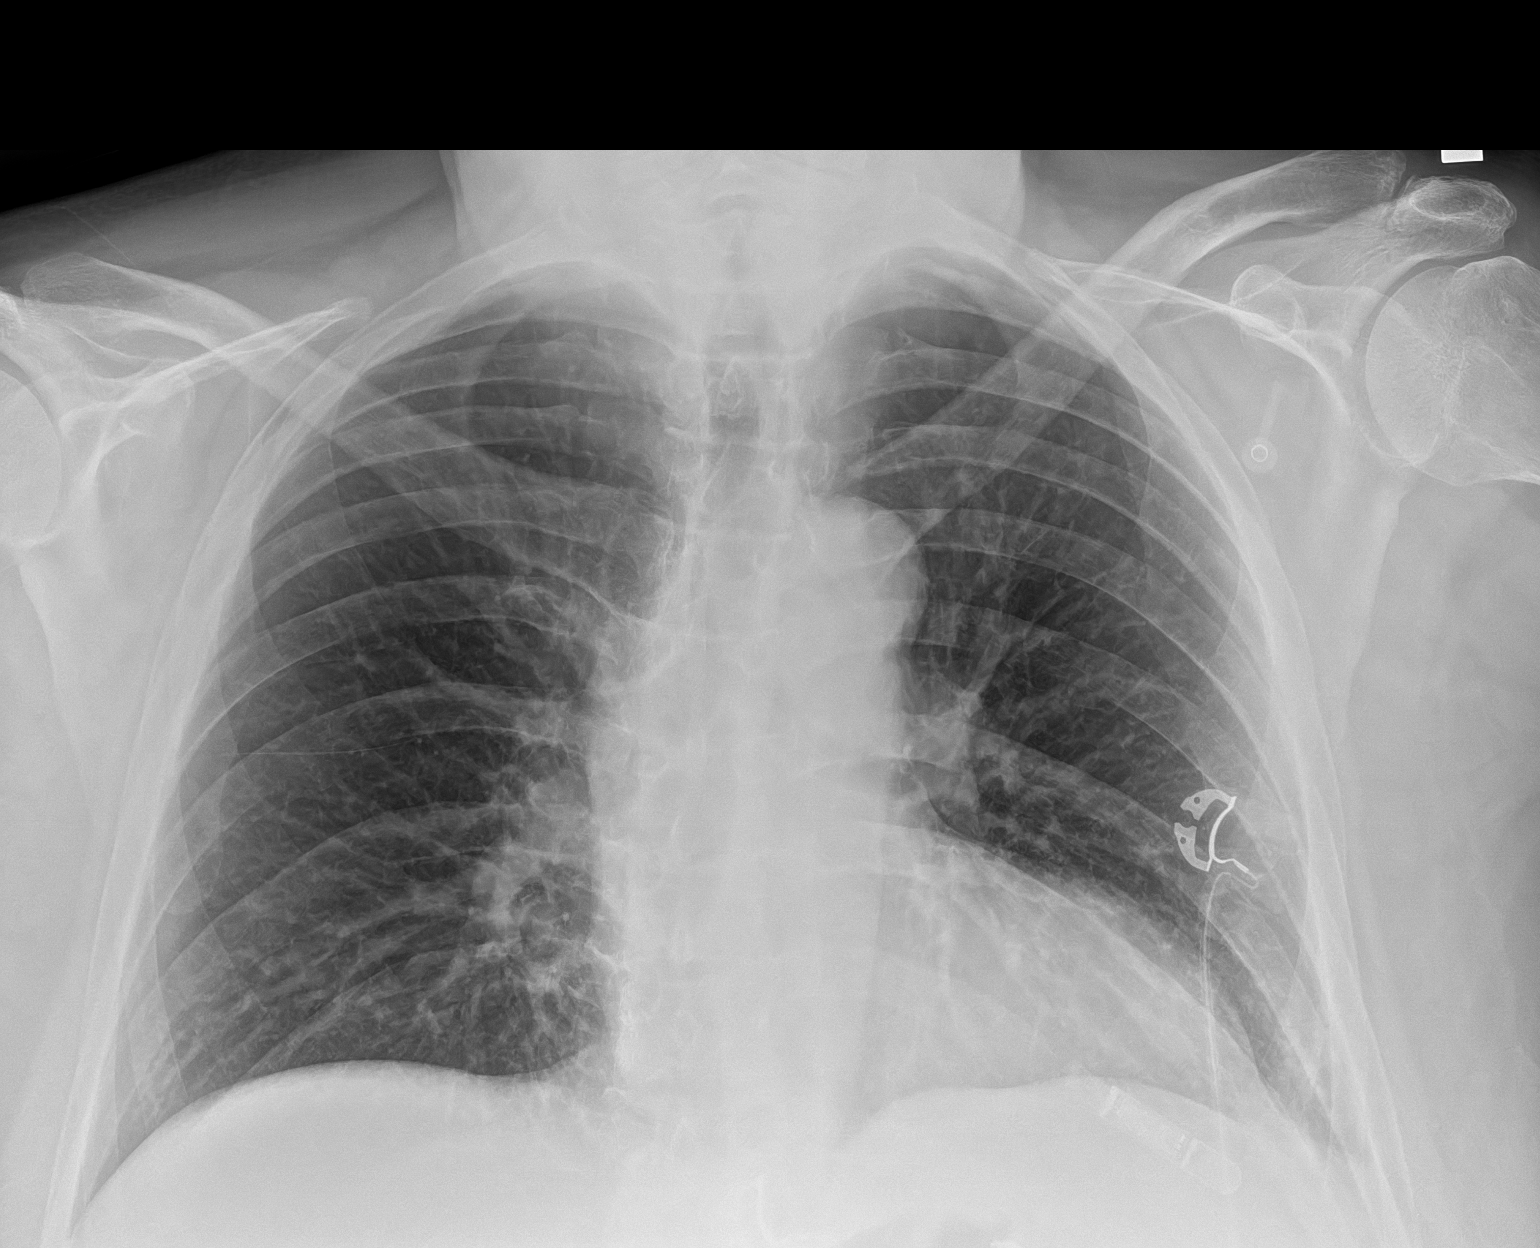

[1 of 1 positions shown; findings below may reference images not displayed]

FINDINGS: Loop recorder noted. There is potentially mild subsegmental
atelectasis along the left lateral hemidiaphragm. Otherwise the
lungs appear clear. Heart size within normal limits for projection.
No blunting of the costophrenic angles.
IMPRESSION: 1. There is potentially subsegmental atelectasis along the left
hemidiaphragm. The lungs appear otherwise clear.
2. Loop recorder noted.

## 2023-08-27 DIAGNOSIS — Z79899 Other long term (current) drug therapy: Secondary | ICD-10-CM | POA: Diagnosis not present

## 2023-08-27 DIAGNOSIS — L4050 Arthropathic psoriasis, unspecified: Secondary | ICD-10-CM | POA: Diagnosis not present

## 2023-09-17 DIAGNOSIS — E1140 Type 2 diabetes mellitus with diabetic neuropathy, unspecified: Secondary | ICD-10-CM | POA: Diagnosis not present

## 2023-11-26 DIAGNOSIS — Z79899 Other long term (current) drug therapy: Secondary | ICD-10-CM | POA: Diagnosis not present

## 2023-11-26 DIAGNOSIS — L4050 Arthropathic psoriasis, unspecified: Secondary | ICD-10-CM | POA: Diagnosis not present

## 2023-11-26 DIAGNOSIS — E790 Hyperuricemia without signs of inflammatory arthritis and tophaceous disease: Secondary | ICD-10-CM | POA: Diagnosis not present

## 2023-12-03 DIAGNOSIS — M79641 Pain in right hand: Secondary | ICD-10-CM | POA: Diagnosis not present

## 2023-12-03 DIAGNOSIS — Z13820 Encounter for screening for osteoporosis: Secondary | ICD-10-CM | POA: Diagnosis not present

## 2023-12-03 DIAGNOSIS — E790 Hyperuricemia without signs of inflammatory arthritis and tophaceous disease: Secondary | ICD-10-CM | POA: Diagnosis not present

## 2023-12-03 DIAGNOSIS — D649 Anemia, unspecified: Secondary | ICD-10-CM | POA: Diagnosis not present

## 2023-12-03 DIAGNOSIS — R7989 Other specified abnormal findings of blood chemistry: Secondary | ICD-10-CM | POA: Diagnosis not present

## 2023-12-03 DIAGNOSIS — Z789 Other specified health status: Secondary | ICD-10-CM | POA: Diagnosis not present

## 2023-12-03 DIAGNOSIS — M3589 Other specified systemic involvement of connective tissue: Secondary | ICD-10-CM | POA: Diagnosis not present

## 2023-12-03 DIAGNOSIS — M79642 Pain in left hand: Secondary | ICD-10-CM | POA: Diagnosis not present

## 2023-12-03 DIAGNOSIS — L4050 Arthropathic psoriasis, unspecified: Secondary | ICD-10-CM | POA: Diagnosis not present

## 2023-12-03 DIAGNOSIS — Z2889 Immunization not carried out for other reason: Secondary | ICD-10-CM | POA: Diagnosis not present

## 2023-12-03 DIAGNOSIS — E559 Vitamin D deficiency, unspecified: Secondary | ICD-10-CM | POA: Diagnosis not present

## 2023-12-03 DIAGNOSIS — L409 Psoriasis, unspecified: Secondary | ICD-10-CM | POA: Diagnosis not present

## 2024-01-14 DIAGNOSIS — E1140 Type 2 diabetes mellitus with diabetic neuropathy, unspecified: Secondary | ICD-10-CM | POA: Diagnosis not present

## 2024-01-14 DIAGNOSIS — Z79899 Other long term (current) drug therapy: Secondary | ICD-10-CM | POA: Diagnosis not present

## 2024-01-14 DIAGNOSIS — I6300 Cerebral infarction due to thrombosis of unspecified precerebral artery: Secondary | ICD-10-CM | POA: Diagnosis not present

## 2024-01-14 DIAGNOSIS — N400 Enlarged prostate without lower urinary tract symptoms: Secondary | ICD-10-CM | POA: Diagnosis not present

## 2024-02-25 DIAGNOSIS — Z79899 Other long term (current) drug therapy: Secondary | ICD-10-CM | POA: Diagnosis not present

## 2024-02-25 DIAGNOSIS — L4050 Arthropathic psoriasis, unspecified: Secondary | ICD-10-CM | POA: Diagnosis not present

## 2024-03-03 DIAGNOSIS — D649 Anemia, unspecified: Secondary | ICD-10-CM | POA: Diagnosis not present

## 2024-03-03 DIAGNOSIS — E559 Vitamin D deficiency, unspecified: Secondary | ICD-10-CM | POA: Diagnosis not present

## 2024-03-03 DIAGNOSIS — E119 Type 2 diabetes mellitus without complications: Secondary | ICD-10-CM | POA: Diagnosis not present

## 2024-03-03 DIAGNOSIS — E663 Overweight: Secondary | ICD-10-CM | POA: Diagnosis not present

## 2024-05-19 DIAGNOSIS — Z79899 Other long term (current) drug therapy: Secondary | ICD-10-CM | POA: Diagnosis not present

## 2024-05-19 DIAGNOSIS — G4089 Other seizures: Secondary | ICD-10-CM | POA: Diagnosis not present

## 2024-05-19 DIAGNOSIS — E1140 Type 2 diabetes mellitus with diabetic neuropathy, unspecified: Secondary | ICD-10-CM | POA: Diagnosis not present

## 2024-05-26 DIAGNOSIS — R001 Bradycardia, unspecified: Secondary | ICD-10-CM | POA: Diagnosis not present

## 2024-05-26 DIAGNOSIS — E1140 Type 2 diabetes mellitus with diabetic neuropathy, unspecified: Secondary | ICD-10-CM | POA: Diagnosis not present

## 2024-05-26 DIAGNOSIS — G40001 Localization-related (focal) (partial) idiopathic epilepsy and epileptic syndromes with seizures of localized onset, not intractable, with status epilepticus: Secondary | ICD-10-CM | POA: Diagnosis not present

## 2024-06-04 ENCOUNTER — Encounter: Payer: Self-pay | Admitting: Gastroenterology

## 2024-06-09 DIAGNOSIS — M79672 Pain in left foot: Secondary | ICD-10-CM | POA: Diagnosis not present

## 2024-06-09 DIAGNOSIS — M79671 Pain in right foot: Secondary | ICD-10-CM | POA: Diagnosis not present

## 2024-06-09 DIAGNOSIS — E559 Vitamin D deficiency, unspecified: Secondary | ICD-10-CM | POA: Diagnosis not present

## 2024-06-09 DIAGNOSIS — E119 Type 2 diabetes mellitus without complications: Secondary | ICD-10-CM | POA: Diagnosis not present

## 2024-06-09 DIAGNOSIS — E663 Overweight: Secondary | ICD-10-CM | POA: Diagnosis not present

## 2024-06-09 DIAGNOSIS — D649 Anemia, unspecified: Secondary | ICD-10-CM | POA: Diagnosis not present

## 2024-06-10 ENCOUNTER — Emergency Department (HOSPITAL_COMMUNITY)
Admission: EM | Admit: 2024-06-10 | Discharge: 2024-06-10 | Disposition: A | Discharge status: Home / Self Care | Attending: Emergency Medicine | Admitting: Emergency Medicine

## 2024-06-10 ENCOUNTER — Emergency Department (HOSPITAL_COMMUNITY)

## 2024-06-10 ENCOUNTER — Other Ambulatory Visit: Payer: Self-pay

## 2024-06-10 ENCOUNTER — Encounter (HOSPITAL_COMMUNITY): Payer: Self-pay

## 2024-06-10 DIAGNOSIS — S299XXA Unspecified injury of thorax, initial encounter: Secondary | ICD-10-CM | POA: Diagnosis present

## 2024-06-10 DIAGNOSIS — I10 Essential (primary) hypertension: Secondary | ICD-10-CM | POA: Insufficient documentation

## 2024-06-10 DIAGNOSIS — R109 Unspecified abdominal pain: Secondary | ICD-10-CM | POA: Insufficient documentation

## 2024-06-10 DIAGNOSIS — M48061 Spinal stenosis, lumbar region without neurogenic claudication: Secondary | ICD-10-CM | POA: Diagnosis not present

## 2024-06-10 DIAGNOSIS — Z79899 Other long term (current) drug therapy: Secondary | ICD-10-CM | POA: Diagnosis not present

## 2024-06-10 DIAGNOSIS — M5459 Other low back pain: Secondary | ICD-10-CM | POA: Diagnosis present

## 2024-06-10 DIAGNOSIS — Y92410 Unspecified street and highway as the place of occurrence of the external cause: Secondary | ICD-10-CM | POA: Diagnosis not present

## 2024-06-10 DIAGNOSIS — M47812 Spondylosis without myelopathy or radiculopathy, cervical region: Secondary | ICD-10-CM | POA: Diagnosis not present

## 2024-06-10 DIAGNOSIS — E119 Type 2 diabetes mellitus without complications: Secondary | ICD-10-CM | POA: Diagnosis not present

## 2024-06-10 DIAGNOSIS — S0990XA Unspecified injury of head, initial encounter: Secondary | ICD-10-CM | POA: Diagnosis not present

## 2024-06-10 DIAGNOSIS — M4802 Spinal stenosis, cervical region: Secondary | ICD-10-CM | POA: Diagnosis not present

## 2024-06-10 DIAGNOSIS — N200 Calculus of kidney: Secondary | ICD-10-CM | POA: Insufficient documentation

## 2024-06-10 DIAGNOSIS — Z8546 Personal history of malignant neoplasm of prostate: Secondary | ICD-10-CM | POA: Insufficient documentation

## 2024-06-10 DIAGNOSIS — S199XXA Unspecified injury of neck, initial encounter: Secondary | ICD-10-CM | POA: Diagnosis not present

## 2024-06-10 DIAGNOSIS — K429 Umbilical hernia without obstruction or gangrene: Secondary | ICD-10-CM | POA: Insufficient documentation

## 2024-06-10 DIAGNOSIS — Z7984 Long term (current) use of oral hypoglycemic drugs: Secondary | ICD-10-CM | POA: Insufficient documentation

## 2024-06-10 DIAGNOSIS — S22081A Stable burst fracture of T11-T12 vertebra, initial encounter for closed fracture: Secondary | ICD-10-CM | POA: Diagnosis not present

## 2024-06-10 DIAGNOSIS — I6523 Occlusion and stenosis of bilateral carotid arteries: Secondary | ICD-10-CM | POA: Diagnosis not present

## 2024-06-10 DIAGNOSIS — K8020 Calculus of gallbladder without cholecystitis without obstruction: Secondary | ICD-10-CM | POA: Insufficient documentation

## 2024-06-10 DIAGNOSIS — I6789 Other cerebrovascular disease: Secondary | ICD-10-CM | POA: Diagnosis not present

## 2024-06-10 DIAGNOSIS — Z7902 Long term (current) use of antithrombotics/antiplatelets: Secondary | ICD-10-CM | POA: Diagnosis not present

## 2024-06-10 DIAGNOSIS — Z8673 Personal history of transient ischemic attack (TIA), and cerebral infarction without residual deficits: Secondary | ICD-10-CM | POA: Diagnosis not present

## 2024-06-10 DIAGNOSIS — Z041 Encounter for examination and observation following transport accident: Secondary | ICD-10-CM | POA: Diagnosis not present

## 2024-06-10 DIAGNOSIS — R000 Tachycardia, unspecified: Secondary | ICD-10-CM | POA: Diagnosis not present

## 2024-06-10 LAB — I-STAT CHEM 8, ED
BUN: 28 mg/dL — ABNORMAL HIGH (ref 8–23)
Calcium, Ion: 1.1 mmol/L — ABNORMAL LOW (ref 1.15–1.40)
Chloride: 104 mmol/L (ref 98–111)
Creatinine, Ser: 1.5 mg/dL — ABNORMAL HIGH (ref 0.61–1.24)
Glucose, Bld: 156 mg/dL — ABNORMAL HIGH (ref 70–99)
HCT: 46 % (ref 39.0–52.0)
Hemoglobin: 15.6 g/dL (ref 13.0–17.0)
Potassium: 5.2 mmol/L — ABNORMAL HIGH (ref 3.5–5.1)
Sodium: 139 mmol/L (ref 135–145)
TCO2: 26 mmol/L (ref 22–32)

## 2024-06-10 LAB — CBC
HCT: 44.6 % (ref 39.0–52.0)
Hemoglobin: 14.6 g/dL (ref 13.0–17.0)
MCH: 34.4 pg — ABNORMAL HIGH (ref 26.0–34.0)
MCHC: 32.7 g/dL (ref 30.0–36.0)
MCV: 105.2 fL — ABNORMAL HIGH (ref 80.0–100.0)
Platelets: 197 K/uL (ref 150–400)
RBC: 4.24 MIL/uL (ref 4.22–5.81)
RDW: 14.1 % (ref 11.5–15.5)
WBC: 10.5 K/uL (ref 4.0–10.5)
nRBC: 0 % (ref 0.0–0.2)

## 2024-06-10 LAB — COMPREHENSIVE METABOLIC PANEL WITH GFR
ALT: 24 U/L (ref 0–44)
AST: 33 U/L (ref 15–41)
Albumin: 3.7 g/dL (ref 3.5–5.0)
Alkaline Phosphatase: 103 U/L (ref 38–126)
Anion gap: 10 (ref 5–15)
BUN: 23 mg/dL (ref 8–23)
CO2: 26 mmol/L (ref 22–32)
Calcium: 9.4 mg/dL (ref 8.9–10.3)
Chloride: 104 mmol/L (ref 98–111)
Creatinine, Ser: 1.51 mg/dL — ABNORMAL HIGH (ref 0.61–1.24)
GFR, Estimated: 49 mL/min — ABNORMAL LOW (ref >60)
Glucose, Bld: 154 mg/dL — ABNORMAL HIGH (ref 70–99)
Potassium: 5 mmol/L (ref 3.5–5.1)
Sodium: 140 mmol/L (ref 135–145)
Total Bilirubin: 1 mg/dL (ref 0.0–1.2)
Total Protein: 7.3 g/dL (ref 6.5–8.1)

## 2024-06-10 LAB — MAGNESIUM: Magnesium: 1.6 mg/dL — ABNORMAL LOW (ref 1.7–2.4)

## 2024-06-10 LAB — I-STAT CG4 LACTIC ACID, ED: Lactic Acid, Venous: 1.6 mmol/L (ref 0.5–1.9)

## 2024-06-10 MED ORDER — OXYCODONE-ACETAMINOPHEN 5-325 MG PO TABS
ORAL_TABLET | ORAL | Status: AC
  Filled 2024-06-10: qty 2

## 2024-06-10 MED ORDER — OXYCODONE-ACETAMINOPHEN 5-325 MG PO TABS
2.0000 | ORAL_TABLET | Freq: Once | ORAL | Status: AC
  Administered 2024-06-10: 2 via ORAL

## 2024-06-10 MED ORDER — OXYCODONE HCL 5 MG PO TABS: 5.0000 mg | ORAL_TABLET | Freq: Four times a day (QID) | ORAL | 0 refills | Status: AC | PRN

## 2024-06-10 MED ORDER — IOHEXOL 350 MG/ML SOLN
75.0000 mL | Freq: Once | INTRAVENOUS | Status: AC | PRN
  Administered 2024-06-10: 75 mL via INTRAVENOUS

## 2024-06-10 MED ORDER — OXYCODONE HCL 5 MG PO TABS: 5.0000 mg | ORAL_TABLET | Freq: Four times a day (QID) | ORAL | 0 refills | Status: DC | PRN

## 2024-06-10 MED ORDER — MORPHINE SULFATE (PF) 4 MG/ML IV SOLN
4.0000 mg | Freq: Once | INTRAVENOUS | Status: AC
  Administered 2024-06-10: 4 mg via INTRAVENOUS
  Filled 2024-06-10: qty 1
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
## 2024-06-10 NOTE — Discharge Instructions (Signed)
 Return for any problem.  ?

## 2024-06-10 NOTE — ED Provider Notes (Signed)
  Physical Exam  BP 123/79   Pulse (!) 58   Temp 98 F (36.7 C) (Oral)   Resp 16   SpO2 100%   Physical Exam  Procedures  Procedures  ED Course / MDM    Medical Decision Making Risk Prescription drug management.    Patient seen by neurosurgery.  Recommend TLSO brace.  Discharge home with outpatient follow-up in 6 weeks.  Will discharge.      [name], MD 06/10/24 [phone]

## 2024-06-10 NOTE — ED Notes (Signed)
Ortho called 
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
## 2024-06-10 NOTE — Progress Notes (Signed)
 Orthopedic Tech Progress Note Patient Details:  GLENDAL CASSADAY Jan 01, 1951 [id number]  Ortho Devices Type of Ortho Device: Thoracolumbar corset (TLSO) Ortho Device/Splint Interventions: Ordered, Application, Adjustment, Removal   Post Interventions Patient Tolerated: Well Instructions Provided: Care of device, Adjustment of device  Anjela Cassara A Jamaury Gumz 06/10/2024, 4:45 PM

## 2024-06-10 NOTE — ED Notes (Signed)
 Pt verbalized understanding of discharge instructions pt ambulatory at time of discharge. Pt wheeled from ed family to drive home.

## 2024-07-07 ENCOUNTER — Ambulatory Visit: Admitting: Gastroenterology

## 2024-07-25 DIAGNOSIS — Z6825 Body mass index (BMI) 25.0-25.9, adult: Secondary | ICD-10-CM | POA: Diagnosis not present

## 2024-07-25 DIAGNOSIS — M549 Dorsalgia, unspecified: Secondary | ICD-10-CM | POA: Diagnosis not present

## 2024-07-25 DIAGNOSIS — S22081A Stable burst fracture of T11-T12 vertebra, initial encounter for closed fracture: Secondary | ICD-10-CM | POA: Diagnosis not present

## 2024-07-31 NOTE — Progress Notes (Signed)
 Surgical Instructions   Your procedure is scheduled on December  22. Report to [facility] Main Entrance A at 10 A.M., then check in with the Admitting office. Any questions or running late day of surgery: call [phone]  Questions prior to your surgery date: call [phone], Monday-Friday, 8am-4pm. If you experience any cold or flu symptoms such as cough, fever, chills, shortness of breath, etc. between now and your scheduled surgery, please notify us  at the above number.     Remember:  Do not eat after midnight the night before your surgery  You may drink clear liquids until 9:00am the morning of your surgery.   Clear liquids allowed are: Water, Non-Citrus Juices (without pulp), Carbonated Beverages, Clear Tea (no milk, honey, etc.), Black Coffee Only (NO MILK, CREAM OR POWDERED CREAMER of any kind), and Gatorade.    Take these medicines the morning of surgery with A SIP OF WATER  allopurinol  (ZYLOPRIM )  levETIRAcetam  (KEPPRA )  metoprolol  tartrate (LOPRESSOR )   May take these medicines IF NEEDED: acetaminophen  (TYLENOL ) oxyCODONE  (ROXICODONE )   One week prior to surgery, STOP taking any Aspirin  (unless otherwise instructed by your surgeon) Aleve, Naproxen, Ibuprofen, Motrin, Advil, Goody's, BC's, all herbal medications, fish oil, and non-prescription vitamins.          WHAT DO I DO ABOUT MY DIABETES MEDICATION?   Do not take oral diabetes medicines (pills) the morning of surgery. Do not take  metFORMIN (GLUCOPHAGE)  the morning of surgery.   HOW TO MANAGE YOUR DIABETES BEFORE AND AFTER SURGERY  Why is it important to control my blood sugar before and after surgery? Improving blood sugar levels before and after surgery helps healing and can limit problems. A way of improving blood sugar control is eating a healthy diet by:  Eating less sugar and carbohydrates  Increasing activity/exercise  Talking with your doctor about reaching your blood sugar goals High blood  sugars (greater than 180 mg/dL) can raise your risk of infections and slow your recovery, so you will need to focus on controlling your diabetes during the weeks before surgery. Make sure that the doctor who takes care of your diabetes knows about your planned surgery including the date and location.  How do I manage my blood sugar before surgery? Check your blood sugar at least 4 times a day, starting 2 days before surgery, to make sure that the level is not too high or low.  Check your blood sugar the morning of your surgery when you wake up and every 2 hours until you get to the Short Stay unit.  If your blood sugar is less than 70 mg/dL, you will need to treat for low blood sugar: Do not take insulin . Treat a low blood sugar (less than 70 mg/dL) with  cup of clear juice (cranberry or apple), 4 glucose tablets, OR glucose gel. Recheck blood sugar in 15 minutes after treatment (to make sure it is greater than 70 mg/dL). If your blood sugar is not greater than 70 mg/dL on recheck, call [phone] for further instructions. Report your blood sugar to the short stay nurse when you get to Short Stay.  If you are admitted to the hospital after surgery: Your blood sugar will be checked by the staff and you will probably be given insulin  after surgery (instead of oral diabetes medicines) to make sure you have good blood sugar levels. The goal for blood sugar control after surgery is 80-180 mg/dL.  Do NOT Smoke (Tobacco/Vaping) for 24 hours prior to your procedure.  If you use a CPAP at night, you may bring your mask/headgear for your overnight stay.   You will be asked to remove any contacts, glasses, piercing's, hearing aid's, dentures/partials prior to surgery. Please bring cases for these items if needed.    Patients discharged the day of surgery will not be allowed to drive home, and someone needs to stay with them for 24 hours.  SURGICAL WAITING ROOM VISITATION Patients may have  no more than 2 support people in the waiting area - these visitors may rotate.   Pre-op nurse will coordinate an appropriate time for 1 ADULT support person, who may not rotate, to accompany patient in pre-op.  Children under the age of 14 must have an adult with them who is not the patient and must remain in the main waiting area with an adult.  If the patient needs to stay at the hospital during part of their recovery, the visitor guidelines for inpatient rooms apply.  Please refer to the [facility] website for the visitor guidelines for any additional information.   If you received a COVID test during your pre-op visit  it is requested that you wear a mask when out in public, stay away from anyone that may not be feeling well and notify your surgeon if you develop symptoms. If you have been in contact with anyone that has tested positive in the last 10 days please notify you surgeon.      Pre-operative 4 CHG Bathing Instructions   You can play a key role in reducing the risk of infection after surgery. Your skin needs to be as free of germs as possible. You can reduce the number of germs on your skin by washing with CHG (chlorhexidine  gluconate) soap before surgery. CHG is an antiseptic soap that kills germs and continues to kill germs even after washing.   DO NOT use if you have an allergy to chlorhexidine /CHG or antibacterial soaps. If your skin becomes reddened or irritated, stop using the CHG and notify one of our RNs at [phone].   Please shower with the CHG soap starting 4 days before surgery using the following schedule:     Please keep in mind the following:  DO NOT shave, including legs and underarms, starting the day of your first shower.   You may shave your face at any point before/day of surgery.  Place clean sheets on your bed the day you start using CHG soap. Use a clean washcloth (not used since being washed) for each shower. DO NOT sleep with pets once you start  using the CHG.   CHG Shower Instructions:  Wash your face and private area with normal soap. If you choose to wash your hair, wash first with your normal shampoo.  After you use shampoo/soap, rinse your hair and body thoroughly to remove shampoo/soap residue.  Turn the water OFF and apply  bottle of CHG soap to a CLEAN washcloth.  Apply CHG soap ONLY FROM YOUR NECK DOWN TO YOUR TOES (washing for 3-5 minutes)  DO NOT use CHG soap on face, private areas, open wounds, or sores.  Pay special attention to the area where your surgery is being performed.  If you are having back surgery, having someone wash your back for you may be helpful. Wait 2 minutes after CHG soap is applied, then you may rinse off the CHG soap.  Pat dry with a clean towel  Put on clean clothes/pajamas   If you choose to wear lotion, please use ONLY the CHG-compatible lotions that are listed below.  Additional instructions for the day of surgery:  If you choose, you may shower the morning of surgery with an antibacterial soap.  DO NOT APPLY any lotions, deodorants, cologne, or perfumes.   Do not bring valuables to the hospital. [facility] is not responsible for any belongings/valuables. Do not wear nail polish, gel polish, artificial nails, or any other type of covering on natural nails (fingers and toes) Do not wear jewelry or makeup Put on clean/comfortable clothes.  Please brush your teeth.  Ask your nurse before applying any prescription medications to the skin.     CHG Compatible Lotions   Aveeno Moisturizing lotion  Cetaphil Moisturizing Cream  Cetaphil Moisturizing Lotion  Clairol Herbal Essence Moisturizing Lotion, Dry Skin  Clairol Herbal Essence Moisturizing Lotion, Extra Dry Skin  Clairol Herbal Essence Moisturizing Lotion, Normal Skin  Curel Age Defying Therapeutic Moisturizing Lotion with Alpha Hydroxy  Curel Extreme Care Body Lotion  Curel Soothing Hands Moisturizing Hand Lotion  Curel  Therapeutic Moisturizing Cream, Fragrance-Free  Curel Therapeutic Moisturizing Lotion, Fragrance-Free  Curel Therapeutic Moisturizing Lotion, Original Formula  Eucerin Daily Replenishing Lotion  Eucerin Dry Skin Therapy Plus Alpha Hydroxy Crme  Eucerin Dry Skin Therapy Plus Alpha Hydroxy Lotion  Eucerin Original Crme  Eucerin Original Lotion  Eucerin Plus Crme Eucerin Plus Lotion  Eucerin TriLipid Replenishing Lotion  Keri Anti-Bacterial Hand Lotion  Keri Deep Conditioning Original Lotion Dry Skin Formula Softly Scented  Keri Deep Conditioning Original Lotion, Fragrance Free Sensitive Skin Formula  Keri Lotion Fast Absorbing Fragrance Free Sensitive Skin Formula  Keri Lotion Fast Absorbing Softly Scented Dry Skin Formula  Keri Original Lotion  Keri Skin Renewal Lotion Keri Silky Smooth Lotion  Keri Silky Smooth Sensitive Skin Lotion  Nivea Body Creamy Conditioning Oil  Nivea Body Extra Enriched Lotion  Nivea Body Original Lotion  Nivea Body Sheer Moisturizing Lotion Nivea Crme  Nivea Skin Firming Lotion  NutraDerm 30 Skin Lotion  NutraDerm Skin Lotion  NutraDerm Therapeutic Skin Cream  NutraDerm Therapeutic Skin Lotion  ProShield Protective Hand Cream  Provon moisturizing lotion  Please read over the following fact sheets that you were given.

## 2024-08-01 ENCOUNTER — Other Ambulatory Visit: Payer: Self-pay

## 2024-08-01 ENCOUNTER — Encounter (HOSPITAL_COMMUNITY)
Admission: RE | Admit: 2024-08-01 | Discharge: 2024-08-01 | Disposition: A | Discharge status: Home / Self Care | Source: Ambulatory Visit

## 2024-08-01 ENCOUNTER — Encounter (HOSPITAL_COMMUNITY): Payer: Self-pay

## 2024-08-01 VITALS — BP 159/94 | HR 57 | Temp 97.8°F | Resp 18 | Ht 76.0 in | Wt 212.0 lb

## 2024-08-01 DIAGNOSIS — K8020 Calculus of gallbladder without cholecystitis without obstruction: Secondary | ICD-10-CM | POA: Insufficient documentation

## 2024-08-01 DIAGNOSIS — I10 Essential (primary) hypertension: Secondary | ICD-10-CM | POA: Insufficient documentation

## 2024-08-01 DIAGNOSIS — Z7984 Long term (current) use of oral hypoglycemic drugs: Secondary | ICD-10-CM | POA: Insufficient documentation

## 2024-08-01 DIAGNOSIS — E119 Type 2 diabetes mellitus without complications: Secondary | ICD-10-CM | POA: Insufficient documentation

## 2024-08-01 DIAGNOSIS — S22081A Stable burst fracture of T11-T12 vertebra, initial encounter for closed fracture: Secondary | ICD-10-CM | POA: Insufficient documentation

## 2024-08-01 DIAGNOSIS — Z8673 Personal history of transient ischemic attack (TIA), and cerebral infarction without residual deficits: Secondary | ICD-10-CM | POA: Insufficient documentation

## 2024-08-01 DIAGNOSIS — M47812 Spondylosis without myelopathy or radiculopathy, cervical region: Secondary | ICD-10-CM | POA: Insufficient documentation

## 2024-08-01 DIAGNOSIS — L4050 Arthropathic psoriasis, unspecified: Secondary | ICD-10-CM | POA: Insufficient documentation

## 2024-08-01 DIAGNOSIS — I2510 Atherosclerotic heart disease of native coronary artery without angina pectoris: Secondary | ICD-10-CM | POA: Insufficient documentation

## 2024-08-01 DIAGNOSIS — Z01812 Encounter for preprocedural laboratory examination: Secondary | ICD-10-CM | POA: Insufficient documentation

## 2024-08-01 DIAGNOSIS — G4733 Obstructive sleep apnea (adult) (pediatric): Secondary | ICD-10-CM | POA: Insufficient documentation

## 2024-08-01 DIAGNOSIS — Z01818 Encounter for other preprocedural examination: Secondary | ICD-10-CM

## 2024-08-01 DIAGNOSIS — M47816 Spondylosis without myelopathy or radiculopathy, lumbar region: Secondary | ICD-10-CM | POA: Insufficient documentation

## 2024-08-01 DIAGNOSIS — K429 Umbilical hernia without obstruction or gangrene: Secondary | ICD-10-CM | POA: Insufficient documentation

## 2024-08-01 DIAGNOSIS — M51369 Other intervertebral disc degeneration, lumbar region without mention of lumbar back pain or lower extremity pain: Secondary | ICD-10-CM | POA: Insufficient documentation

## 2024-08-01 DIAGNOSIS — Z7902 Long term (current) use of antithrombotics/antiplatelets: Secondary | ICD-10-CM | POA: Insufficient documentation

## 2024-08-01 HISTORY — DX: Other overlap syndromes: M35.1

## 2024-08-01 LAB — CBC
HCT: 42.9 % (ref 39.0–52.0)
Hemoglobin: 14.2 g/dL (ref 13.0–17.0)
MCH: 34.9 pg — ABNORMAL HIGH (ref 26.0–34.0)
MCHC: 33.1 g/dL (ref 30.0–36.0)
MCV: 105.4 fL — ABNORMAL HIGH (ref 80.0–100.0)
Platelets: 184 K/uL (ref 150–400)
RBC: 4.07 MIL/uL — ABNORMAL LOW (ref 4.22–5.81)
RDW: 14.1 % (ref 11.5–15.5)
WBC: 7.1 K/uL (ref 4.0–10.5)
nRBC: 0 % (ref 0.0–0.2)

## 2024-08-01 LAB — BASIC METABOLIC PANEL WITH GFR
Anion gap: 7 (ref 5–15)
BUN: 18 mg/dL (ref 8–23)
CO2: 30 mmol/L (ref 22–32)
Calcium: 9.6 mg/dL (ref 8.9–10.3)
Chloride: 104 mmol/L (ref 98–111)
Creatinine, Ser: 1.24 mg/dL (ref 0.61–1.24)
GFR, Estimated: >60 mL/min
Glucose, Bld: 89 mg/dL (ref 70–99)
Potassium: 4.4 mmol/L (ref 3.5–5.1)
Sodium: 141 mmol/L (ref 135–145)

## 2024-08-01 LAB — SURGICAL PCR SCREEN
MRSA, PCR: NEGATIVE
Staphylococcus aureus: NEGATIVE

## 2024-08-01 LAB — HEMOGLOBIN A1C
Hgb A1c MFr Bld: 5.6 % (ref 4.8–5.6)
Mean Plasma Glucose: 114.02 mg/dL

## 2024-08-01 LAB — GLUCOSE, CAPILLARY: Glucose-Capillary: 92 mg/dL (ref 70–99)

## 2024-08-01 LAB — TYPE AND SCREEN
ABO/RH(D): A POS
Antibody Screen: NEGATIVE

## 2024-08-01 NOTE — Anesthesia Preprocedure Evaluation (Signed)
"                                    Anesthesia Evaluation  Patient identified by MRN, date of birth, ID band Patient awake    Reviewed: Allergy & Precautions, H&P , NPO status , Patient's Chart, lab work & pertinent test results, reviewed documented beta blocker date and time   Airway Mallampati: II  TM Distance: >3 FB Neck ROM: Full    Dental no notable dental hx. (+) Teeth Intact, Dental Advisory Given   Pulmonary neg pulmonary ROS   Pulmonary exam normal breath sounds clear to auscultation       Cardiovascular hypertension, Pt. on medications and Pt. on home beta blockers  Rhythm:Regular Rate:Normal     Neuro/Psych CVA, No Residual Symptoms  negative psych ROS   GI/Hepatic negative GI ROS, Neg liver ROS,,,  Endo/Other  diabetes, Type 2, Oral Hypoglycemic Agents    Renal/GU negative Renal ROS  negative genitourinary   Musculoskeletal  (+) Arthritis , Osteoarthritis,    Abdominal   Peds  Hematology  (+) Blood dyscrasia, anemia   Anesthesia Other Findings   Reproductive/Obstetrics negative OB ROS                              Anesthesia Physical Anesthesia Plan  ASA: 3  Anesthesia Plan: General   Post-op Pain Management: Tylenol  PO (pre-op)*   Induction: Intravenous  PONV Risk Score and Plan: 3 and Ondansetron , Dexamethasone  and Treatment may vary due to age or medical condition  Airway Management Planned: Oral ETT  Additional Equipment: Arterial line  Intra-op Plan:   Post-operative Plan: Extubation in OR  Informed Consent: I have reviewed the patients History and Physical, chart, labs and discussed the procedure including the risks, benefits and alternatives for the proposed anesthesia with the patient or authorized representative who has indicated his/her understanding and acceptance.     Dental advisory given  Plan Discussed with: CRNA  Anesthesia Plan Comments: (PAT note written 08/01/2024 by Isay Perleberg, PA-C.  )         Anesthesia Quick Evaluation  "
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
## 2024-08-01 NOTE — Progress Notes (Signed)
 PCP - Dr. Gaither Langton Cardiologist - denies  PPM/ICD - denies   Chest x-ray - 06/10/24 EKG - 06/10/24 Stress Test - many years ago ECHO - 09/19/18 Cardiac Cath - denies  Sleep Study - denies   Fasting Blood Sugar - 100-150 Pt says he does not check his CBG often  Last dose of GLP1 agonist-  n/a   Blood Thinner Instructions: Hold Plavix  7 days. Last dose 12/12 Aspirin  Instructions: n/a  ERAS Protcol - clears until 0900   COVID TEST- n/a   Anesthesia review: yes, hx of stroke, loop recorder (pt states is no longer working)  Patient denies shortness of breath, fever, cough and chest pain at PAT appointment   All instructions explained to the patient, with a verbal understanding of the material. Patient agrees to go over the instructions while at home for a better understanding. The opportunity to ask questions was provided.

## 2024-08-01 NOTE — Progress Notes (Signed)
" °   08/01/24 1108  OBSTRUCTIVE SLEEP APNEA  Have you ever been diagnosed with sleep apnea through a sleep study? No  Do you snore loudly (loud enough to be heard through closed doors)?  1  Do you often feel tired, fatigued, or sleepy during the daytime (such as falling asleep during driving or talking to someone)? 0  Has anyone observed you stop breathing during your sleep? 1  Do you have, or are you being treated for high blood pressure? 1  BMI more than 35 kg/m2? 0  Age > 50 (1-yes) 1  Neck circumference greater than:Male 16 inches or larger, Male 17inches or larger? 1  Male Gender (Yes=1) 1  Obstructive Sleep Apnea Score 6  Score 5 or greater  Results sent to PCP    "

## 2024-08-04 ENCOUNTER — Encounter (HOSPITAL_COMMUNITY): Payer: Self-pay

## 2024-08-04 ENCOUNTER — Inpatient Hospital Stay (HOSPITAL_COMMUNITY)
Admission: RE | Admit: 2024-08-04 | Discharge: 2024-08-06 | DRG: 448 | Disposition: A | Discharge status: Home / Self Care

## 2024-08-04 ENCOUNTER — Inpatient Hospital Stay (HOSPITAL_COMMUNITY): Admitting: Anesthesiology

## 2024-08-04 ENCOUNTER — Inpatient Hospital Stay (HOSPITAL_COMMUNITY)

## 2024-08-04 ENCOUNTER — Inpatient Hospital Stay (HOSPITAL_COMMUNITY): Payer: Self-pay | Admitting: Vascular Surgery

## 2024-08-04 ENCOUNTER — Other Ambulatory Visit: Payer: Self-pay

## 2024-08-04 ENCOUNTER — Encounter (HOSPITAL_COMMUNITY): Admission: RE | Disposition: A | Discharge status: Home / Self Care | Payer: Self-pay | Source: Home / Self Care

## 2024-08-04 DIAGNOSIS — I10 Essential (primary) hypertension: Secondary | ICD-10-CM | POA: Diagnosis present

## 2024-08-04 DIAGNOSIS — I2510 Atherosclerotic heart disease of native coronary artery without angina pectoris: Secondary | ICD-10-CM | POA: Diagnosis present

## 2024-08-04 DIAGNOSIS — S22082A Unstable burst fracture of T11-T12 vertebra, initial encounter for closed fracture: Principal | ICD-10-CM | POA: Diagnosis present

## 2024-08-04 DIAGNOSIS — Z87442 Personal history of urinary calculi: Secondary | ICD-10-CM

## 2024-08-04 DIAGNOSIS — Z79899 Other long term (current) drug therapy: Secondary | ICD-10-CM | POA: Diagnosis not present

## 2024-08-04 DIAGNOSIS — Z8249 Family history of ischemic heart disease and other diseases of the circulatory system: Secondary | ICD-10-CM

## 2024-08-04 DIAGNOSIS — Z806 Family history of leukemia: Secondary | ICD-10-CM | POA: Diagnosis not present

## 2024-08-04 DIAGNOSIS — Z7984 Long term (current) use of oral hypoglycemic drugs: Secondary | ICD-10-CM

## 2024-08-04 DIAGNOSIS — S22081A Stable burst fracture of T11-T12 vertebra, initial encounter for closed fracture: Secondary | ICD-10-CM

## 2024-08-04 DIAGNOSIS — Z803 Family history of malignant neoplasm of breast: Secondary | ICD-10-CM

## 2024-08-04 DIAGNOSIS — M40204 Unspecified kyphosis, thoracic region: Secondary | ICD-10-CM | POA: Diagnosis present

## 2024-08-04 DIAGNOSIS — Z823 Family history of stroke: Secondary | ICD-10-CM

## 2024-08-04 DIAGNOSIS — Z8673 Personal history of transient ischemic attack (TIA), and cerebral infarction without residual deficits: Secondary | ICD-10-CM | POA: Diagnosis not present

## 2024-08-04 DIAGNOSIS — E119 Type 2 diabetes mellitus without complications: Secondary | ICD-10-CM | POA: Diagnosis present

## 2024-08-04 DIAGNOSIS — L4050 Arthropathic psoriasis, unspecified: Secondary | ICD-10-CM | POA: Diagnosis present

## 2024-08-04 DIAGNOSIS — Z8546 Personal history of malignant neoplasm of prostate: Secondary | ICD-10-CM | POA: Diagnosis not present

## 2024-08-04 DIAGNOSIS — Z01812 Encounter for preprocedural laboratory examination: Secondary | ICD-10-CM | POA: Diagnosis not present

## 2024-08-04 DIAGNOSIS — Z7902 Long term (current) use of antithrombotics/antiplatelets: Secondary | ICD-10-CM

## 2024-08-04 DIAGNOSIS — Z419 Encounter for procedure for purposes other than remedying health state, unspecified: Principal | ICD-10-CM

## 2024-08-04 DIAGNOSIS — Y92410 Unspecified street and highway as the place of occurrence of the external cause: Secondary | ICD-10-CM | POA: Diagnosis not present

## 2024-08-04 HISTORY — PX: POSTERIOR THORACIC FUSION 4 LEVELS: SHX7622

## 2024-08-04 LAB — GLUCOSE, CAPILLARY
Glucose-Capillary: 105 mg/dL — ABNORMAL HIGH (ref 70–99)
Glucose-Capillary: 72 mg/dL (ref 70–99)
Glucose-Capillary: 78 mg/dL (ref 70–99)
Glucose-Capillary: 107 mg/dL — ABNORMAL HIGH (ref 70–99)
Glucose-Capillary: 135 mg/dL — ABNORMAL HIGH (ref 70–99)

## 2024-08-04 LAB — ABO/RH: ABO/RH(D): A POS

## 2024-08-04 SURGERY — POSTERIOR THORACIC FUSION 4 LEVELS
Anesthesia: General

## 2024-08-04 MED ORDER — THROMBIN 5000 UNITS EX SOLR
OROMUCOSAL | Status: DC | PRN
  Administered 2024-08-04: 5 mL via TOPICAL

## 2024-08-04 MED ORDER — INSULIN ASPART 100 UNIT/ML IJ SOLN
0.0000 [IU] | Freq: Three times a day (TID) | INTRAMUSCULAR | Status: DC
  Administered 2024-08-05: 5 [IU] via SUBCUTANEOUS
  Administered 2024-08-05 (×2): 3 [IU] via SUBCUTANEOUS
  Filled 2024-08-04: qty 3

## 2024-08-04 MED ORDER — INSULIN ASPART 100 UNIT/ML IJ SOLN
4.0000 [IU] | Freq: Three times a day (TID) | INTRAMUSCULAR | Status: DC
  Administered 2024-08-05 (×3): 4 [IU] via SUBCUTANEOUS
  Filled 2024-08-04 (×4): qty 4

## 2024-08-04 MED ORDER — SODIUM CHLORIDE 0.9% FLUSH: 3.0000 mL | INTRAVENOUS | Status: DC | PRN

## 2024-08-04 MED ORDER — LIDOCAINE 2% (20 MG/ML) 5 ML SYRINGE
INTRAMUSCULAR | Status: AC
  Filled 2024-08-04: qty 10

## 2024-08-04 MED ORDER — INSULIN ASPART 100 UNIT/ML IJ SOLN: 0.0000 [IU] | INTRAMUSCULAR | Status: DC | PRN

## 2024-08-04 MED ORDER — FENTANYL CITRATE (PF) 100 MCG/2ML IJ SOLN
INTRAMUSCULAR | Status: AC
  Filled 2024-08-04: qty 2

## 2024-08-04 MED ORDER — DEXAMETHASONE SOD PHOSPHATE PF 10 MG/ML IJ SOLN
INTRAMUSCULAR | Status: DC | PRN
  Administered 2024-08-04: 8 mg via INTRAVENOUS

## 2024-08-04 MED ORDER — ALBUMIN HUMAN 5 % IV SOLN
12.5000 g | Freq: Once | INTRAVENOUS | Status: AC
  Administered 2024-08-04: 12.5 g via INTRAVENOUS

## 2024-08-04 MED ORDER — 0.9 % SODIUM CHLORIDE (POUR BTL) OPTIME
TOPICAL | Status: DC | PRN
  Administered 2024-08-04: 1000 mL

## 2024-08-04 MED ORDER — MAGNESIUM CITRATE PO SOLN: 1.0000 | Freq: Once | ORAL | Status: DC | PRN

## 2024-08-04 MED ORDER — PROPOFOL 10 MG/ML IV BOLUS
INTRAVENOUS | Status: DC | PRN
  Administered 2024-08-04: 150 mg via INTRAVENOUS

## 2024-08-04 MED ORDER — PHENYLEPHRINE 80 MCG/ML (10ML) SYRINGE FOR IV PUSH (FOR BLOOD PRESSURE SUPPORT)
PREFILLED_SYRINGE | INTRAVENOUS | Status: DC | PRN
  Administered 2024-08-04: 240 ug via INTRAVENOUS

## 2024-08-04 MED ORDER — HYDROMORPHONE HCL 1 MG/ML IJ SOLN: 1.0000 mg | INTRAMUSCULAR | Status: DC | PRN

## 2024-08-04 MED ORDER — ORAL CARE MOUTH RINSE: 15.0000 mL | Freq: Once | OROMUCOSAL | Status: AC

## 2024-08-04 MED ORDER — MIDAZOLAM HCL (PF) 2 MG/2ML IJ SOLN
INTRAMUSCULAR | Status: DC | PRN
  Administered 2024-08-04 (×2): 1 mg via INTRAVENOUS

## 2024-08-04 MED ORDER — HYDROXYCHLOROQUINE SULFATE 200 MG PO TABS
200.0000 mg | ORAL_TABLET | Freq: Two times a day (BID) | ORAL | Status: DC
  Administered 2024-08-04 – 2024-08-06 (×4): 200 mg via ORAL
  Filled 2024-08-04 (×4): qty 1

## 2024-08-04 MED ORDER — FENTANYL CITRATE (PF) 100 MCG/2ML IJ SOLN
INTRAMUSCULAR | Status: DC | PRN
  Administered 2024-08-04 (×2): 50 ug via INTRAVENOUS

## 2024-08-04 MED ORDER — CEFAZOLIN SODIUM-DEXTROSE 2-4 GM/100ML-% IV SOLN
2.0000 g | Freq: Four times a day (QID) | INTRAVENOUS | Status: AC
  Administered 2024-08-04 – 2024-08-05 (×2): 2 g via INTRAVENOUS
  Filled 2024-08-04 (×2): qty 100

## 2024-08-04 MED ORDER — LACTATED RINGERS IV SOLN: INTRAVENOUS | Status: DC

## 2024-08-04 MED ORDER — POLYETHYLENE GLYCOL 3350 17 G PO PACK
17.0000 g | PACK | Freq: Every day | ORAL | Status: DC
  Administered 2024-08-04 – 2024-08-06 (×3): 17 g via ORAL
  Filled 2024-08-04 (×3): qty 1

## 2024-08-04 MED ORDER — ACETAMINOPHEN 650 MG RE SUPP: 650.0000 mg | RECTAL | Status: DC | PRN

## 2024-08-04 MED ORDER — ONDANSETRON HCL 4 MG/2ML IJ SOLN
INTRAMUSCULAR | Status: DC | PRN
  Administered 2024-08-04: 4 mg via INTRAVENOUS

## 2024-08-04 MED ORDER — ALBUMIN HUMAN 5 % IV SOLN
INTRAVENOUS | Status: AC
  Filled 2024-08-04: qty 250

## 2024-08-04 MED ORDER — DOCUSATE SODIUM 100 MG PO CAPS
100.0000 mg | ORAL_CAPSULE | Freq: Two times a day (BID) | ORAL | Status: DC
  Administered 2024-08-04 – 2024-08-06 (×4): 100 mg via ORAL
  Filled 2024-08-04 (×4): qty 1

## 2024-08-04 MED ORDER — ALLOPURINOL 300 MG PO TABS
300.0000 mg | ORAL_TABLET | Freq: Every day | ORAL | Status: DC
  Administered 2024-08-05 – 2024-08-06 (×2): 300 mg via ORAL
  Filled 2024-08-04 (×2): qty 1

## 2024-08-04 MED ORDER — CYCLOBENZAPRINE HCL 10 MG PO TABS
10.0000 mg | ORAL_TABLET | Freq: Three times a day (TID) | ORAL | Status: DC
  Administered 2024-08-04 – 2024-08-06 (×5): 10 mg via ORAL
  Filled 2024-08-04 (×5): qty 1

## 2024-08-04 MED ORDER — MENTHOL 3 MG MT LOZG: 1.0000 | LOZENGE | OROMUCOSAL | Status: DC | PRN

## 2024-08-04 MED ORDER — PROPOFOL 500 MG/50ML IV EMUL
INTRAVENOUS | Status: DC | PRN
  Administered 2024-08-04: 130 ug/kg/min via INTRAVENOUS
  Administered 2024-08-04: 120 ug/kg/min via INTRAVENOUS

## 2024-08-04 MED ORDER — DEXTROSE 50 % IV SOLN
INTRAVENOUS | Status: AC
  Filled 2024-08-04: qty 50

## 2024-08-04 MED ORDER — PROPOFOL 10 MG/ML IV BOLUS
INTRAVENOUS | Status: AC
  Filled 2024-08-04: qty 20

## 2024-08-04 MED ORDER — ONDANSETRON HCL 4 MG/2ML IJ SOLN
INTRAMUSCULAR | Status: AC
  Filled 2024-08-04: qty 8

## 2024-08-04 MED ORDER — CHLORHEXIDINE GLUCONATE 0.12 % MT SOLN
15.0000 mL | Freq: Once | OROMUCOSAL | Status: AC
  Administered 2024-08-04: 15 mL via OROMUCOSAL
  Filled 2024-08-04: qty 15

## 2024-08-04 MED ORDER — BUPIVACAINE-EPINEPHRINE 0.5% -1:200000 IJ SOLN
INTRAMUSCULAR | Status: DC | PRN
  Administered 2024-08-04: 10 mL

## 2024-08-04 MED ORDER — ROCURONIUM BROMIDE 10 MG/ML (PF) SYRINGE
PREFILLED_SYRINGE | INTRAVENOUS | Status: AC
  Filled 2024-08-04: qty 50

## 2024-08-04 MED ORDER — ACETAMINOPHEN 500 MG PO TABS
1000.0000 mg | ORAL_TABLET | Freq: Once | ORAL | Status: AC
  Administered 2024-08-04: 1000 mg via ORAL
  Filled 2024-08-04: qty 2

## 2024-08-04 MED ORDER — SUGAMMADEX SODIUM 200 MG/2ML IV SOLN
INTRAVENOUS | Status: DC | PRN
  Administered 2024-08-04: 200 mg via INTRAVENOUS

## 2024-08-04 MED ORDER — HYDROMORPHONE HCL 1 MG/ML IJ SOLN
0.2500 mg | INTRAMUSCULAR | Status: DC | PRN
  Administered 2024-08-04: 0.25 mg via INTRAVENOUS
  Administered 2024-08-04: 0.5 mg via INTRAVENOUS

## 2024-08-04 MED ORDER — ENOXAPARIN SODIUM 40 MG/0.4ML IJ SOSY
40.0000 mg | PREFILLED_SYRINGE | INTRAMUSCULAR | Status: DC
  Administered 2024-08-06: 40 mg via SUBCUTANEOUS
  Filled 2024-08-04: qty 0.4

## 2024-08-04 MED ORDER — ONDANSETRON HCL 4 MG/2ML IJ SOLN: 4.0000 mg | Freq: Four times a day (QID) | INTRAMUSCULAR | Status: DC | PRN

## 2024-08-04 MED ORDER — SODIUM CHLORIDE 0.9% FLUSH
3.0000 mL | Freq: Two times a day (BID) | INTRAVENOUS | Status: DC
  Administered 2024-08-04 – 2024-08-06 (×3): 3 mL via INTRAVENOUS

## 2024-08-04 MED ORDER — THROMBIN 5000 UNITS EX KIT
PACK | CUTANEOUS | Status: AC
  Filled 2024-08-04: qty 1

## 2024-08-04 MED ORDER — PHENYLEPHRINE 80 MCG/ML (10ML) SYRINGE FOR IV PUSH (FOR BLOOD PRESSURE SUPPORT)
PREFILLED_SYRINGE | INTRAVENOUS | Status: AC
  Filled 2024-08-04: qty 30

## 2024-08-04 MED ORDER — ACETAMINOPHEN 325 MG PO TABS
650.0000 mg | ORAL_TABLET | ORAL | Status: DC | PRN
  Filled 2024-08-04: qty 2

## 2024-08-04 MED ORDER — BISACODYL 5 MG PO TBEC: 5.0000 mg | DELAYED_RELEASE_TABLET | Freq: Every day | ORAL | Status: DC | PRN

## 2024-08-04 MED ORDER — SODIUM CHLORIDE 0.9 % IV SOLN: INTRAVENOUS | Status: AC

## 2024-08-04 MED ORDER — PHENOL 1.4 % MT LIQD: 1.0000 | OROMUCOSAL | Status: DC | PRN

## 2024-08-04 MED ORDER — METOPROLOL TARTRATE 25 MG PO TABS
25.0000 mg | ORAL_TABLET | Freq: Two times a day (BID) | ORAL | Status: DC
  Administered 2024-08-04 – 2024-08-06 (×2): 25 mg via ORAL
  Filled 2024-08-04 (×4): qty 1

## 2024-08-04 MED ORDER — KETAMINE HCL 50 MG/5ML IJ SOSY
PREFILLED_SYRINGE | INTRAMUSCULAR | Status: AC
  Filled 2024-08-04: qty 5

## 2024-08-04 MED ORDER — CEFAZOLIN SODIUM-DEXTROSE 2-3 GM-%(50ML) IV SOLR
INTRAVENOUS | Status: DC | PRN
  Administered 2024-08-04: 2 g via INTRAVENOUS

## 2024-08-04 MED ORDER — INSULIN ASPART 100 UNIT/ML IJ SOLN: 0.0000 [IU] | Freq: Every day | INTRAMUSCULAR | Status: DC

## 2024-08-04 MED ORDER — HYDROMORPHONE HCL 1 MG/ML IJ SOLN
INTRAMUSCULAR | Status: AC
  Filled 2024-08-04: qty 1

## 2024-08-04 MED ORDER — BUPIVACAINE-EPINEPHRINE (PF) 0.5% -1:200000 IJ SOLN
INTRAMUSCULAR | Status: AC
  Filled 2024-08-04: qty 30

## 2024-08-04 MED ORDER — KETAMINE HCL 10 MG/ML IJ SOLN
INTRAMUSCULAR | Status: DC | PRN
  Administered 2024-08-04: 20 mg via INTRAVENOUS
  Administered 2024-08-04: 10 mg via INTRAVENOUS

## 2024-08-04 MED ORDER — OXYCODONE HCL 5 MG PO TABS
5.0000 mg | ORAL_TABLET | ORAL | Status: DC | PRN
  Administered 2024-08-04 – 2024-08-06 (×6): 5 mg via ORAL
  Filled 2024-08-04 (×6): qty 1

## 2024-08-04 MED ORDER — ROCURONIUM BROMIDE 10 MG/ML (PF) SYRINGE
PREFILLED_SYRINGE | INTRAVENOUS | Status: DC | PRN
  Administered 2024-08-04: 50 mg via INTRAVENOUS

## 2024-08-04 MED ORDER — MIDAZOLAM HCL 2 MG/2ML IJ SOLN
INTRAMUSCULAR | Status: AC
  Filled 2024-08-04: qty 2

## 2024-08-04 MED ORDER — LEVETIRACETAM 500 MG PO TABS
500.0000 mg | ORAL_TABLET | Freq: Two times a day (BID) | ORAL | Status: DC
  Administered 2024-08-04 – 2024-08-06 (×4): 500 mg via ORAL
  Filled 2024-08-04 (×4): qty 1

## 2024-08-04 MED ORDER — LIDOCAINE 2% (20 MG/ML) 5 ML SYRINGE
INTRAMUSCULAR | Status: DC | PRN
  Administered 2024-08-04: 60 mg via INTRAVENOUS

## 2024-08-04 MED ORDER — PHENYLEPHRINE HCL-NACL 20-0.9 MG/250ML-% IV SOLN
INTRAVENOUS | Status: DC | PRN
  Administered 2024-08-04: 50 ug/min via INTRAVENOUS

## 2024-08-04 MED ORDER — SODIUM CHLORIDE 0.9 % IV SOLN: 250.0000 mL | INTRAVENOUS | Status: AC

## 2024-08-04 MED ORDER — ONDANSETRON HCL 4 MG PO TABS: 4.0000 mg | ORAL_TABLET | Freq: Four times a day (QID) | ORAL | Status: DC | PRN

## 2024-08-04 MED FILL — Albumin, Human Inj 5%: INTRAVENOUS | Qty: 250 | Status: AC

## 2024-08-04 SURGICAL SUPPLY — 62 items
ALLOGRAFT ARTHROCELL PLUS 5 (Bone Implant) IMPLANT
BAG COUNTER SPONGE SURGICOUNT (BAG) ×1 IMPLANT
BLADE CLIPPER SURG (BLADE) IMPLANT
BUR 14 MATCH 3 (BUR) IMPLANT
BUR MATCHSTICK NEURO 3.0 LAGG (BURR) ×1 IMPLANT
BUR MR8 14 BALL 5 (BUR) IMPLANT
CANISTER SUCTION 3000ML PPV (SUCTIONS) ×1 IMPLANT
CAP LOCKING THREADED (Cap) IMPLANT
COVER BACK TABLE 60X90IN (DRAPES) ×1 IMPLANT
DERMABOND ADVANCED .7 DNX12 (GAUZE/BANDAGES/DRESSINGS) IMPLANT
DRAPE C-ARM 42X72 X-RAY (DRAPES) ×2 IMPLANT
DRAPE SHEET LG 3/4 BI-LAMINATE (DRAPES) ×4 IMPLANT
DRAPE SURG 17X23 STRL (DRAPES) ×1 IMPLANT
DRAPE UTILITY XL STRL (DRAPES) ×1 IMPLANT
DRSG OPSITE POSTOP 4X6 (GAUZE/BANDAGES/DRESSINGS) IMPLANT
DURAPREP 26ML APPLICATOR (WOUND CARE) ×4 IMPLANT
ELECTRODE BLADE INSULATED 4IN (ELECTROSURGICAL) ×1 IMPLANT
ELECTRODE REM PT RTRN 9FT ADLT (ELECTROSURGICAL) ×1 IMPLANT
FEE COVERAGE SUPPORT O-ARM (MISCELLANEOUS) ×1 IMPLANT
GAUZE 4X4 16PLY ~~LOC~~+RFID DBL (SPONGE) IMPLANT
GAUZE SPONGE 4X4 12PLY STRL (GAUZE/BANDAGES/DRESSINGS) IMPLANT
GLOVE INDICATOR 8.5 STRL (GLOVE) ×4 IMPLANT
GLOVE SURG SYN 8.0 PF PI (GLOVE) ×3 IMPLANT
GOWN STRL REUS W/ TWL LRG LVL3 (GOWN DISPOSABLE) ×1 IMPLANT
GOWN STRL REUS W/ TWL XL LVL3 (GOWN DISPOSABLE) ×2 IMPLANT
GOWN STRL REUS W/TWL 2XL LVL3 (GOWN DISPOSABLE) IMPLANT
GRAFT BNE CHIP CANC 1-8 40 (Bone Implant) IMPLANT
HEMOSTAT POWDER KIT SURGIFOAM (HEMOSTASIS) ×1 IMPLANT
KIT BASIN OR (CUSTOM PROCEDURE TRAY) ×1 IMPLANT
KIT POSITIONER JACKSON TABLE (MISCELLANEOUS) ×1 IMPLANT
KIT TURNOVER KIT B (KITS) ×1 IMPLANT
MARKER SPHERE PSV REFLC NDI (MISCELLANEOUS) ×5 IMPLANT
NDL HYPO 18GX1.5 BLUNT FILL (NEEDLE) IMPLANT
NDL HYPO 22X1.5 SAFETY MO (MISCELLANEOUS) ×1 IMPLANT
NEEDLE HYPO 18GX1.5 BLUNT FILL (NEEDLE) IMPLANT
NEEDLE HYPO 22X1.5 SAFETY MO (MISCELLANEOUS) ×1 IMPLANT
PACK LAMINECTOMY NEURO (CUSTOM PROCEDURE TRAY) ×1 IMPLANT
PACK UNIVERSAL I (CUSTOM PROCEDURE TRAY) ×1 IMPLANT
PAD ARMBOARD POSITIONER FOAM (MISCELLANEOUS) ×3 IMPLANT
PIN BONE FIX 100 (PIN) IMPLANT
ROD HEX 5.5MMX300MM TITAN (Rod) IMPLANT
SCREW CREO ONE ROB MOD 7.5X55 (Screw) IMPLANT
SCREW MOD SD CREO 7.5X50 (Screw) IMPLANT
SCREW PA THRD CREO TULIP 5.5X4 (Head) IMPLANT
SCREW SD MOD CREO 6.5X45 (Screw) IMPLANT
SCREW SD MOD CREO 6.5X55 (Screw) IMPLANT
SCREW SP MOD CREO 6.5X35 (Screw) IMPLANT
SEALER BIPOLAR AQUA 6.0 (INSTRUMENTS) ×1 IMPLANT
SOLN 0.9% NACL POUR BTL 1000ML (IV SOLUTION) ×1 IMPLANT
SOLN STERILE WATER BTL 1000 ML (IV SOLUTION) ×1 IMPLANT
SOLUTION IRRIG SURGIPHOR (IV SOLUTION) IMPLANT
SPIKE FLUID TRANSFER (MISCELLANEOUS) ×1 IMPLANT
SPONGE SURGIFOAM ABS GEL SZ50 (HEMOSTASIS) ×1 IMPLANT
SPONGE T-LAP 4X18 ~~LOC~~+RFID (SPONGE) IMPLANT
STYLET INTUB SATIN SLIP 14FR (MISCELLANEOUS) IMPLANT
SUT MNCRL AB 4-0 PS2 18 (SUTURE) ×1 IMPLANT
SUT VIC AB 1 CT1 18XBRD ANBCTR (SUTURE) ×1 IMPLANT
SUT VIC AB 2-0 CP2 18 (SUTURE) ×1 IMPLANT
SUT VIC AB 3-0 SH 8-18 (SUTURE) ×1 IMPLANT
SYR 3ML LL SCALE MARK (SYRINGE) ×1 IMPLANT
TOWEL GREEN STERILE (TOWEL DISPOSABLE) ×1 IMPLANT
TOWEL GREEN STERILE FF (TOWEL DISPOSABLE) ×1 IMPLANT

## 2024-08-04 NOTE — H&P (Signed)
 Neurosurgery Consult Note  Assessment:  73 yo M w/ hx DM1 A2c 5.6, psoriatic arthritis who presents with T12 burst fx with kyphosis. Failed TLSO  Plan:  T12 ORIF     CC: back pain  HPI:     Patient is a 73 y.o. male w/ hx controled DM2, psoriatic arthritis who had a fall 6 weeks ago with T12 burst fracture. He failed TLSO bracing. Still with severe back pain   Patient Active Problem List   Diagnosis Date Noted   Seizure (HCC) 09/12/2018   CVA (cerebral vascular accident) (HCC) 09/12/2018   Essential hypertension 09/12/2018   Diabetes mellitus type 2 in nonobese (HCC) 09/12/2018   Cerebral infarction due to embolism of left middle cerebral artery (HCC)    Sebaceous cyst    Malignant neoplasm of prostate (HCC) 08/25/2016   Past Medical History:  Diagnosis Date   Anemia    Arthritis    psoriatic   Cancer (HCC)    prostate   Diabetes mellitus    Gout    H/O mixed connective tissue disease    History of kidney stones    Hypertension    Kidney stones    Mixed connective tissue disease    Psoriasis    Stroke (HCC) 09/19/2018   Wears glasses    Wears partial dentures    upper    Past Surgical History:  Procedure Laterality Date   LOOP RECORDER INSERTION N/A 09/19/2018   Procedure: LOOP RECORDER INSERTION;  Surgeon: Kelsie Agent, MD;  Location: [facility] IN[facility]IVE [facility];  Service: Cardiovascular;  Laterality: N/A;   LOOP RECORDER INSERTION  09/19/2018   Procedure: LOOP RECORDER INSERTION;  Surgeon: Pietro Redell RAMAN, MD;  Location: [facility] ENDOSCOPY;  Service: Cardiovascular;;   MASS EXCISION N/A 05/11/2017   Procedure: EXCISION OF CYST ON SCALP;  Surgeon: Mavis Anes, MD;  Location: [facility] ORS;  Service: General;  Laterality: N/A;   RADIOACTIVE SEED IMPLANT N/A 10/27/2016   Procedure: RADIOACTIVE SEED IMPLANT/BRACHYTHERAPY IMPLANT;  Surgeon: Ricardo Likens, MD;  Location: [facility];  Service: Urology;  Laterality: N/A;   TEE WITHOUT CARDIOVERSION N/A 09/19/2018    Procedure: TRANSESOPHAGEAL ECHOCARDIOGRAM (TEE);  Surgeon: Pietro Redell RAMAN, MD;  Location: [facility] ENDOSCOPY;  Service: Cardiovascular;  Laterality: N/A;   TONSILLECTOMY      Medications Prior to Admission  Medication Sig Dispense Refill Last Dose/Taking   allopurinol  (ZYLOPRIM ) 300 MG tablet Take 300 mg by mouth daily.   08/04/2024 at  7:00 AM   Cholecalciferol (VITAMIN D) 125 MCG CAPS Take 5,000 Units by mouth at bedtime.   08/03/2024   Cyanocobalamin  (B-12) 2500 MCG TABS Take 2,500 mcg by mouth daily.   08/03/2024   folic acid  (FOLVITE ) 1 MG tablet Take 1 mg by mouth daily.   08/03/2024   hydroxychloroquine  (PLAQUENIL ) 200 MG tablet Take 200 mg by mouth 2 (two) times daily.   08/03/2024   levETIRAcetam  (KEPPRA ) 500 MG tablet Take 1 tablet (500 mg total) by mouth 2 (two) times daily. 60 tablet 3 08/04/2024 at  7:00 AM   metFORMIN (GLUCOPHAGE) 500 MG tablet Take 500 mg by mouth 2 (two) times daily with a meal.   08/03/2024   methotrexate  (RHEUMATREX) 2.5 MG tablet Take 20 mg by mouth every Friday.   08/01/2024   metoprolol  tartrate (LOPRESSOR ) 50 MG tablet Take 25 mg by mouth 2 (two) times daily.   08/04/2024 at  7:00 AM   oxyCODONE  (ROXICODONE ) 5 MG immediate release tablet Take 1 tablet (5  mg total) by mouth every 6 (six) hours as needed for severe pain (pain score 7-10). 20 tablet 0 Past Month   simvastatin  (ZOCOR ) 20 MG tablet Take 1 tablet (20 mg total) by mouth at bedtime. 30 tablet 30 08/03/2024   acetaminophen  (TYLENOL ) 500 MG tablet Take 1,000 mg by mouth every 6 (six) hours as needed for moderate pain (pain score 4-6).   More than a month   clopidogrel  (PLAVIX ) 75 MG tablet TAKE 1 TABLET BY MOUTH ONCE DAILY. 90 tablet 0 07/26/2024   colchicine  0.6 MG tablet One tab po q 2 hrs until pain relief or stomach upset (Patient not taking: Reported on 07/30/2024) 12 tablet 0 Not Taking   oseltamivir  (TAMIFLU ) 75 MG capsule Take 1 capsule (75 mg total) by mouth every 12 (twelve) hours. (Patient not  taking: Reported on 07/30/2024) 10 capsule 0 Not Taking   Allergies[1]  Social History   Tobacco Use   Smoking status: Never   Smokeless tobacco: Never  Substance Use Topics   Alcohol use: No    Family History  Problem Relation Age of Onset   Breast cancer Mother    Dementia Mother 25   Stroke Father 43   Leukemia Father    Stroke Brother    CAD Brother 75     Review of Systems Pertinent items are noted in HPI.  Objective:   Patient Vitals for the past 8 hrs:  BP Temp Temp src Pulse Resp SpO2 Height Weight  08/04/24 1013 (!) 157/74 98.3 F (36.8 C) Oral (!) 58 18 96 % 6' 4 (1.93 m) 98 kg   No intake/output data recorded. No intake/output data recorded.    Exam: RUE: 5/5 grip LUE:  5/5 grip RLE: 5/5 IP, 5/5 quad, 5/5 ham, 5/5 DF, 5/5 EHL, 5/5 PF LLE: 5/5 IP, 5/5 quad, 5/5 ham, 5/5 DF, 5/5 EHL, 5/5 PF Sensation to light intact     Data ReviewCBC:  Lab Results  Component Value Date   WBC 7.1 08/01/2024   RBC 4.07 (L) 08/01/2024   BMP:  Lab Results  Component Value Date   GLUCOSE 89 08/01/2024   CO2 30 08/01/2024   BUN 18 08/01/2024   CREATININE 1.24 08/01/2024   CALCIUM  9.6 08/01/2024         [1] No Known Allergies

## 2024-08-04 NOTE — Anesthesia Postprocedure Evaluation (Signed)
"   Anesthesia Post Note  Patient: Peter Martinez  Procedure(s) Performed: POSTERIOR THORACIC FUSION THORACIC TEN-ELEVEN, ELEVEN-TWELVE, TWELVE-LUMBAR ONE, ONE-TWO APPLICATION OF O-ARM     Patient location during evaluation: PACU Anesthesia Type: General Level of consciousness: awake and alert Pain management: pain level controlled Vital Signs Assessment: post-procedure vital signs reviewed and stable Respiratory status: spontaneous breathing, nonlabored ventilation and respiratory function stable Cardiovascular status: blood pressure returned to baseline and stable Postop Assessment: no apparent nausea or vomiting Anesthetic complications: no   There were no known notable events for this encounter.  Last Vitals:  Vitals:   08/04/24 1800 08/04/24 1815  BP: 105/73 (!) 92/50  Pulse: (!) 50 (!) 51  Resp: 11 17  Temp: (!) 35.3 C (!) 35.8 C  SpO2: 92% 92%    Last Pain:  Vitals:   08/04/24 1815  TempSrc:   PainSc: Asleep                 Neira Bentsen,W. EDMOND      "
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
## 2024-08-04 NOTE — Anesthesia Procedure Notes (Signed)
 Procedure Name: Intubation Date/Time: 08/04/2024 1:09 PM  Performed by: Theophilus Burnet, Aloysius Pour, CRNAPre-anesthesia Checklist: Patient identified, Emergency Drugs available, Suction available and Patient being monitored Patient Re-evaluated:Patient Re-evaluated prior to induction Oxygen Delivery Method: Circle system utilized Preoxygenation: Pre-oxygenation with 100% oxygen Induction Type: IV induction Ventilation: Mask ventilation without difficulty Laryngoscope Size: Mac and 4 Grade View: Grade I Tube type: Oral Tube size: 7.0 mm Number of attempts: 1 Airway Equipment and Method: Stylet Placement Confirmation: ETT inserted through vocal cords under direct vision, positive ETCO2 and breath sounds checked- equal and bilateral Secured at: 23 cm Tube secured with: Tape Dental Injury: Teeth and Oropharynx as per pre-operative assessment

## 2024-08-04 NOTE — Transfer of Care (Signed)
 Immediate Anesthesia Transfer of Care Note  Patient: Peter Martinez  Procedure(s) Performed: POSTERIOR THORACIC FUSION THORACIC TEN-ELEVEN, ELEVEN-TWELVE, TWELVE-LUMBAR ONE, ONE-TWO APPLICATION OF O-ARM  Patient Location: PACU  Anesthesia Type:General  Level of Consciousness: drowsy  Airway & Oxygen Therapy: Patient Spontanous Breathing and Patient connected to face mask  Post-op Assessment: Report given to RN  Post vital signs: stable  Last Vitals:  Vitals Value Taken Time  BP 98/54 08/04/24 16:02  Temp    Pulse 53 08/04/24 16:06  Resp 25 08/04/24 16:06  SpO2 100 % 08/04/24 16:06  Vitals shown include unfiled device data.  Last Pain:  Vitals:   08/04/24 1054  TempSrc:   PainSc: 0-No pain         Complications: There were no known notable events for this encounter.
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
## 2024-08-05 ENCOUNTER — Inpatient Hospital Stay (HOSPITAL_COMMUNITY)

## 2024-08-05 ENCOUNTER — Encounter (HOSPITAL_COMMUNITY): Payer: Self-pay

## 2024-08-05 LAB — GLUCOSE, CAPILLARY
Glucose-Capillary: 161 mg/dL — ABNORMAL HIGH (ref 70–99)
Glucose-Capillary: 205 mg/dL — ABNORMAL HIGH (ref 70–99)
Glucose-Capillary: 177 mg/dL — ABNORMAL HIGH (ref 70–99)
Glucose-Capillary: 146 mg/dL — ABNORMAL HIGH (ref 70–99)
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
## 2024-08-05 NOTE — Plan of Care (Signed)
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
## 2024-08-05 NOTE — Discharge Instructions (Signed)
 Wound Care No dressing is required over your incision. Keep clean and dry. You can shower 24 hours after surgery. Let water run over incision. Do not scrub. Pat dry Do not put any creams, lotions, or ointments on incision. Do not submerge your incision for the first 6 weeks (pool, ocean, etc)  Activity Walk each and every day, increasing distance each day. No lifting greater than 10 lbs.  Avoid excessive back/neck\ motion.  Diet Resume your normal diet.  You may restart your home Plavix  on 12/29  Call Your Doctor If Any of These Occur Redness, drainage, or swelling at the wound.  Temperature greater than 101 degrees. Severe pain not relieved by pain medication. Incision starts to come apart.  Follow Up Appt Call [phone] today for appointment in 6 weeks if you don't already have one or for any problems.  If you have any hardware placed in your spine, you will need an x-ray before your appointment.

## 2024-08-06 LAB — GLUCOSE, CAPILLARY
Glucose-Capillary: 116 mg/dL — ABNORMAL HIGH (ref 70–99)
Glucose-Capillary: 160 mg/dL — ABNORMAL HIGH (ref 70–99)

## 2024-08-06 MED ORDER — DOCUSATE SODIUM 100 MG PO CAPS: 100.0000 mg | ORAL_CAPSULE | Freq: Two times a day (BID) | ORAL | 0 refills | Status: AC | PRN

## 2024-08-06 MED ORDER — OXYCODONE HCL 5 MG PO TABS: 5.0000 mg | ORAL_TABLET | Freq: Four times a day (QID) | ORAL | 0 refills | Status: AC | PRN

## 2024-08-06 MED ORDER — CYCLOBENZAPRINE HCL 10 MG PO TABS: 10.0000 mg | ORAL_TABLET | Freq: Three times a day (TID) | ORAL | 3 refills | Status: AC | PRN

## 2024-08-06 NOTE — Discharge Summary (Signed)
 " Physician Discharge Summary  Patient ID: Peter Martinez MRN: [id number] DOB/AGE: July 14, 1951 73 y.o.  Admit date: 08/04/2024 Discharge date: 08/06/2024  Admission Diagnoses:  T12 unstable burst fracture  Discharge Diagnoses:  Same Principal Problem:   Elective surgery   Discharged Condition: Stable  Hospital Course:  Peter Martinez is a 73 y.o. male w/ hx CAD on Plavix , psoriatic arthritis who presented with an unstable T12 burst fracture that failed conservative management. He underwent a T10-L2 posterior instrumentation and fusion. No intra-op complications Postoperatively, the patient was mobilized with the help PT, pain was well-controlled with oral meds. Patient reported improved ambulation ability postop. Patient was deemed ready for discharge.   Treatments: Surgery - T10-L2 posterior instrumentation and fusion  Discharge Exam: Blood pressure 133/72, pulse 98, temperature 98.3 F (36.8 C), temperature source Oral, resp. rate 18, height 6' 4 (1.93 m), weight 92.4 kg, SpO2 97%. Awake, alert, oriented Speech fluent, appropriate CN grossly intact 5/5 BUE/BLE Wound c/d/i  Disposition: Discharge disposition: 01-Home or Self Care       Discharge Instructions     Incentive spirometry RT   Complete by: As directed    Increase activity slowly   Complete by: As directed    No dressing needed   Complete by: As directed       Allergies as of 08/06/2024   No Known Allergies      Medication List     TAKE these medications    acetaminophen  500 MG tablet Commonly known as: TYLENOL  Take 1,000 mg by mouth every 6 (six) hours as needed for moderate pain (pain score 4-6).   allopurinol  300 MG tablet Commonly known as: ZYLOPRIM  Take 300 mg by mouth daily.   B-12 2500 MCG Tabs Take 2,500 mcg by mouth daily.   clopidogrel  75 MG tablet Commonly known as: PLAVIX  TAKE 1 TABLET BY MOUTH ONCE DAILY.   colchicine  0.6 MG tablet One tab po q 2 hrs until pain  relief or stomach upset   cyclobenzaprine  10 MG tablet Commonly known as: FLEXERIL  Take 1 tablet (10 mg total) by mouth 3 (three) times daily as needed for muscle spasms.   docusate sodium  100 MG capsule Commonly known as: COLACE Take 1 capsule (100 mg total) by mouth 2 (two) times daily as needed for mild constipation (Take twice daily while taking narcotic pain medication).   folic acid  1 MG tablet Commonly known as: FOLVITE  Take 1 mg by mouth daily.   hydroxychloroquine  200 MG tablet Commonly known as: PLAQUENIL  Take 200 mg by mouth 2 (two) times daily.   levETIRAcetam  500 MG tablet Commonly known as: KEPPRA  Take 1 tablet (500 mg total) by mouth 2 (two) times daily.   metFORMIN 500 MG tablet Commonly known as: GLUCOPHAGE Take 500 mg by mouth 2 (two) times daily with a meal.   methotrexate  2.5 MG tablet Commonly known as: RHEUMATREX Take 20 mg by mouth every Friday.   metoprolol  tartrate 50 MG tablet Commonly known as: LOPRESSOR  Take 25 mg by mouth 2 (two) times daily.   oseltamivir  75 MG capsule Commonly known as: TAMIFLU  Take 1 capsule (75 mg total) by mouth every 12 (twelve) hours.   oxyCODONE  5 MG immediate release tablet Commonly known as: Roxicodone  Take 1 tablet (5 mg total) by mouth every 6 (six) hours as needed for severe pain (pain score 7-10). What changed: Another medication with the same name was added. Make sure you understand how and when to take each.   oxyCODONE  5  MG immediate release tablet Commonly known as: Oxy IR/ROXICODONE  Take 1 tablet (5 mg total) by mouth every 6 (six) hours as needed for moderate pain (pain score 4-6). What changed: You were already taking a medication with the same name, and this prescription was added. Make sure you understand how and when to take each.   simvastatin  20 MG tablet Commonly known as: ZOCOR  Take 1 tablet (20 mg total) by mouth at bedtime.   Vitamin D 125 MCG Caps Take 5,000 Units by mouth at bedtime.                Durable Medical Equipment  (From admission, onward)           Start     Ordered   08/06/24 0942  For home use only DME Walker rolling  Once       Question Answer Comment  Walker: With 5 Inch Wheels   Patient needs a walker to treat with the following condition Elective surgery      08/06/24 0941   08/06/24 0942  For home use only DME Bedside commode  Once       Question:  Patient needs a bedside commode to treat with the following condition  Answer:  Elective surgery   08/06/24 0941              Discharge Care Instructions  (From admission, onward)           Start     Ordered   08/06/24 0000  No dressing needed        08/06/24 9052            Follow-up Information     Darnella Dorn SAUNDERS, MD Follow up in 6 week(s).   Specialty: Neurosurgery Why: Please call to arrange a 6 week postoperative appointment with Dr. Darnella. Thank you Contact information: [address] [place] [address] [phone]                 Signed: Dorn SAUNDERS Darnella 08/06/2024, 9:47 AM  "
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
## 2024-08-06 NOTE — TOC Transition Note (Signed)
 Transition of Care [facility]) - Discharge Note   Patient Details  Name: Peter Martinez MRN: [id number] Date of Birth: 05-29-1951  Transition of Care [facility] CM/SW Contact:  Roxie KANDICE Stain, RN Phone Number: 08/06/2024, 10:45 AM   Clinical Narrative:    Patient stable for discharge.  DME orders. Spoke to patient's wife who is agreeable to DME. Referral sent to Jermaine with [employer]. Wife will transport home. Address, Phone number and PCP verified.     Final next level of care: Home/Self Care Barriers to Discharge: Barriers Resolved   Patient Goals and CMS Choice Patient states their goals for this hospitalization and ongoing recovery are:: return home CMS Medicare.gov Compare Post Acute Care list provided to:: Patient Choice offered to / list presented to : Patient      Discharge Placement             home          Discharge Plan and Services Additional resources added to the After Visit Summary for                  DME Arranged: Walker rolling, Bedside commode DME Agency: [employer] Date DME Agency Contacted: 08/06/24 Time DME Agency Contacted: 1045 Representative spoke with at DME Agency: [place]            Social Drivers of Health (SDOH) Interventions SDOH Screenings   Food Insecurity: No Food Insecurity (08/04/2024)  Housing: Low Risk (08/05/2024)  Transportation Needs: No Transportation Needs (08/04/2024)  Utilities: Not At Risk (08/05/2024)  Social Connections: Socially Integrated (08/04/2024)  Tobacco Use: Low Risk (08/04/2024)     Readmission Risk Interventions    08/06/2024   10:45 AM  Readmission Risk Prevention Plan  Post Dischage Appt Complete  Medication Screening Complete  Transportation Screening Complete
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
## 2024-08-08 ENCOUNTER — Telehealth: Payer: Self-pay

## 2024-08-08 NOTE — Transitions of Care (Post Inpatient/ED Visit) (Signed)
" ° °  08/08/2024  Name: Peter Martinez MRN: [id number] DOB: February 15, 1951  Today's TOC FU Call Status: Today's TOC FU Call Status:: Unsuccessful Call (1st Attempt) Unsuccessful Call (1st Attempt) Date: 08/08/24  Attempted to reach the patient regarding the most recent Inpatient/ED visit.  Follow Up Plan: Additional outreach attempts will be made to reach the patient to complete the Transitions of Care (Post Inpatient/ED visit) call.   Calin Ellery J. Tommy Goostree RN, MSN [facility], [facility] RN Care Manager Direct Dial: [phone]  Fax: [phone] Website: [email or web address]   "

## 2024-08-12 ENCOUNTER — Telehealth: Payer: Self-pay

## 2024-08-12 DIAGNOSIS — E119 Type 2 diabetes mellitus without complications: Secondary | ICD-10-CM

## 2024-08-12 NOTE — Transitions of Care (Post Inpatient/ED Visit) (Signed)
 "  08/12/2024  Name: Peter Martinez MRN: [id number] DOB: 1951-07-19  Today's TOC FU Call Status: Today's TOC FU Call Status:: Successful TOC FU Call Completed TOC FU Call Complete Date: 08/12/24  Patient's Name and Date of Birth confirmed. DOB, Name  Transition Care Management Follow-up Telephone Call Date of Discharge: 08/06/24 Discharge Facility: [facility]) Type of Discharge: Inpatient Admission Primary Inpatient Discharge Diagnosis:: Elective Surgery-Thoracic Lumber Fusion- T12 unstable burst fracture How have you been since you were released from the hospital?: Better Any questions or concerns?: No  Items Reviewed: Did you receive and understand the discharge instructions provided?: Yes Medications obtained,verified, and reconciled?: Yes (Medications Reviewed) Any new allergies since your discharge?: No Dietary orders reviewed?: Yes Type of Diet Ordered:: Heart healthy Do you have support at home?: Yes People in Home [RPT]: spouse Name of Support/Comfort Primary Source: Romero  Medications Reviewed Today: Medications Reviewed Today     Reviewed by Parris Signer, RN (Case Manager) on 08/12/24 at 1510  Med List Status: <None>   Medication Order Taking? Sig Documenting Provider Last Dose Status Informant  acetaminophen  (TYLENOL ) 500 MG tablet [id number] Yes Take 1,000 mg by mouth every 6 (six) hours as needed for moderate pain (pain score 4-6). [provider]  Active Self  allopurinol  (ZYLOPRIM ) 300 MG tablet [id number] Yes Take 300 mg by mouth daily. [provider]  Active Self           Med Note [name]   Wed Jul 30, 2024 12:14 PM)    Cholecalciferol (VITAMIN D) 125 MCG CAPS [id number] Yes Take 5,000 Units by mouth at bedtime. [provider]  Active Self  clopidogrel  (PLAVIX ) 75 MG tablet [id number] Yes TAKE 1 TABLET BY MOUTH ONCE DAILY. Whitfield Raisin, NP  Active Self           Med Note [name]   Wed Jul 30, 2024 12:14 PM)  ON HOLD  colchicine  0.6 MG tablet [id number]  One tab po q 2 hrs until pain relief or stomach upset  Patient not taking: Reported on 08/12/2024   [name], PA-C  Active Self           Med Note [name] [place] Sep 12, 2018 10:52 AM) LF 01/23/18  Cyanocobalamin  (B-12) 2500 MCG TABS [id number] Yes Take 2,500 mcg by mouth daily. [provider]  Active Self  cyclobenzaprine  (FLEXERIL ) 10 MG tablet [id number] Yes Take 1 tablet (10 mg total) by mouth 3 (three) times daily as needed for muscle spasms. Darnella Dorn JONELLE, MD  Active   docusate sodium  (COLACE) 100 MG capsule [id number] Yes Take 1 capsule (100 mg total) by mouth 2 (two) times daily as needed for mild constipation (Take twice daily while taking narcotic pain medication). Darnella Dorn JONELLE, MD  Active   folic acid  (FOLVITE ) 1 MG tablet [id number] Yes Take 1 mg by mouth daily. [provider]  Active Self  hydroxychloroquine  (PLAQUENIL ) 200 MG tablet [id number] Yes Take 200 mg by mouth 2 (two) times daily. [provider]  Active Self           Med Note SOILA, LYLE BROCKS   Wed Jul 30, 2024 12:15 PM)    levETIRAcetam  (KEPPRA ) 500 MG tablet [id number] Yes Take 1 tablet (500 mg total) by mouth 2 (two) times daily. [name], MD  Active Self  metFORMIN (GLUCOPHAGE) 500 MG tablet [id number] Yes Take 500 mg by mouth 2 (two) times daily with a  meal. [provider]  Active Self  methotrexate  (RHEUMATREX) 2.5 MG tablet [id number] Yes Take 20 mg by mouth every Friday. [provider]  Active Self  metoprolol  tartrate (LOPRESSOR ) 50 MG tablet [id number] Yes Take 25 mg by mouth 2 (two) times daily. [provider]  Active Self  oseltamivir  (TAMIFLU ) 75 MG capsule [id number]  Take 1 capsule (75 mg total) by mouth every 12 (twelve) hours.  Patient not taking: Reported on 07/30/2024   [name], PA-C  Active Self  oxyCODONE  (OXY IR/ROXICODONE ) 5 MG immediate release tablet [id number]  Yes Take 1 tablet (5 mg total) by mouth every 6 (six) hours as needed for moderate pain (pain score 4-6). Darnella Dorn SAUNDERS, MD  Active   oxyCODONE  (ROXICODONE ) 5 MG immediate release tablet [id number]  Take 1 tablet (5 mg total) by mouth every 6 (six) hours as needed for severe pain (pain score 7-10). [name], MD  Active Self           Med Note [name]   Wed Jul 30, 2024 12:14 PM) For use post procedure  simvastatin  (ZOCOR ) 20 MG tablet [id number] Yes Take 1 tablet (20 mg total) by mouth at bedtime. [name], MD  Active Self            Home Care and Equipment/Supplies: Were Home Health Services Ordered?: NA Any new equipment or medical supplies ordered?: Yes Name of Medical supply agency?: [employer]- has walker and BSC Were you able to get the equipment/medical supplies?: Yes Do you have any questions related to the use of the equipment/supplies?: No  Functional Questionnaire: Do you need assistance with bathing/showering or dressing?: Yes (Wife assists) Do you need assistance with meal preparation?: Yes (wife prepares meals) Do you need assistance with eating?: No Do you have difficulty maintaining continence: No Do you need assistance with getting out of bed/getting out of a chair/moving?: Yes (wife assists) Do you have difficulty managing or taking your medications?: No  Follow up appointments reviewed: PCP Follow-up appointment confirmed?: No (Spouse to call for follow up) MD Provider Line Number:[phone] Given: No Specialist Hospital Follow-up appointment confirmed?: Yes Date of Specialist follow-up appointment?: 09/17/24 Follow-Up Specialty Provider:: Orthopedic Surgeon Do you need transportation to your follow-up appointment?: No Do you understand care options if your condition(s) worsen?: Yes-patient verbalized understanding  SDOH Interventions Today    Flowsheet Row Most Recent Value  SDOH Interventions   Food Insecurity Interventions  Intervention Not Indicated  Housing Interventions Intervention Not Indicated  Transportation Interventions Intervention Not Indicated  Utilities Interventions Intervention Not Indicated    Sianne Tejada J. Shakena Callari RN, MSN [facility]  [facility], [facility] RN Care Manager Direct Dial: [phone]  Fax: [phone] Website: [email or web address]   "

## 2024-08-12 NOTE — Progress Notes (Signed)
 Open in error

## 2024-08-12 NOTE — Patient Instructions (Signed)
 Visit Information  Thank you for taking time to visit with me today. Please don't hesitate to contact me if I can be of assistance to you before our next scheduled telephone appointment.  Our next appointment is by telephone on 08/21/24 at 1100 am  Following is a copy of your care plan:   Goals Addressed             This Visit's Progress    VBCI Transitions of Care (TOC) Care Plan       Problems:  Recent Hospitalization for treatment of Elective Surgery-Thoracic Lumber Fusion- T12 unstable burst fracture Knowledge Deficit Related to Elective Surgery-Thoracic Lumber Fusion- T12 unstable burst fracture  Goal:  Over the next 30 days, the patient will not experience hospital readmission  Interventions:  Transitions of Care: 08-12-24 Spoke with spouse Janie, she states patient much better.  He had a period of left knee gout that hampered recovery.  However, patient now is able to get up and walk with the walker. Some pain but using oxycodone  but less and less needed daily.  Surgeon follow up 09-17-24.  Advised to call PCP for follow up.  Reviewed signs of infection.  No signs of infection to mid back incision.    Doctor Visits  - discussed the importance of doctor visits Communication with PCP  re: Enrollment in 30 Day TOC program Post discharge activity limitations prescribed by provider reviewed Post-op wound/incision care reviewed with patient/caregiver Reviewed Signs and symptoms of infection  Patient Self Care Activities:  Attend all scheduled provider appointments Call provider office for new concerns or questions  Notify RN Care Manager of TOC call rescheduling needs Participate in Transition of Care Program/Attend TOC scheduled calls Take medications as prescribed    Plan:  The patient has been provided with contact information for the care management team and has been advised to call with any health related questions or concerns.         Patient verbalizes understanding  of instructions and care plan provided today and agrees to view in MyChart. Active MyChart status and patient understanding of how to access instructions and care plan via MyChart confirmed with patient.     The patient has been provided with contact information for the care management team and has been advised to call with any health related questions or concerns.   Please call the care guide team at [phone] if you need to cancel or reschedule your appointment.   Please call the Suicide and Crisis Lifeline: 988 call the [place]  National Suicide Prevention Lifeline: 312-308-7645 or TTY: 2674384255 TTY 743-485-7050) to talk to a trained counselor if you are experiencing a Mental Health or Behavioral Health Crisis or need someone to talk to.  Donice Alperin J. Kenlee Vogt RN, MSN [facility], [facility] RN Care Manager Direct Dial: [phone]  Fax: [phone] Website: [email or web address]

## 2024-08-12 NOTE — Progress Notes (Signed)
 Complex Care Management Note  Care Guide Note 08/12/2024 Name: Peter Martinez MRN: [id number] DOB: 1950-12-25  Peter Martinez is a 73 y.o. year old male who sees Sheryle Carwin, MD for primary care. I reached out to [employer] by phone today to offer complex care management services.  Mr. Bloodgood was given information about Complex Care Management services today including:   The Complex Care Management services include support from the care team which includes your Nurse Care Manager, Clinical Social Worker, or Pharmacist.  The Complex Care Management team is here to help remove barriers to the health concerns and goals most important to you. Complex Care Management services are voluntary, and the patient may decline or stop services at any time by request to their care team member.   Complex Care Management Consent Status: Patient agreed to services and verbal consent obtained.   Follow up plan:  Telephone appointment with complex care management team member scheduled for:  09/01/2024  Encounter Outcome:  Patient Scheduled    Kemp Paras [facility]  [facility], [facility] Guide Direct Dial:[phone]  Fax: [phone] Website: [email or web address]

## 2024-08-21 ENCOUNTER — Other Ambulatory Visit: Payer: Self-pay

## 2024-08-21 NOTE — Transitions of Care (Post Inpatient/ED Visit) (Signed)
 " Transition of Care week 2  Visit Note  08/21/2024  Name: Peter Martinez MRN: [id number]          DOB: 12/15/1950  Situation: Patient enrolled in [facility] 30-day program. Visit completed with spouse Romero by telephone.   Background:   Initial Transition Care Management Follow-up Telephone Call Discharge Date and Diagnosis: 08/06/24, Elective Surgery-Thoracic Lumber Fusion- T12 unstable burst fracture   Past Medical History:  Diagnosis Date   Anemia    Arthritis    psoriatic   Cancer (HCC)    prostate   Diabetes mellitus    Gout    H/O mixed connective tissue disease    History of kidney stones    Hypertension    Kidney stones    Mixed connective tissue disease    Psoriasis    Stroke (HCC) 09/19/2018   Wears glasses    Wears partial dentures    upper    Assessment: Patient Reported Symptoms: Cognitive Cognitive Status: Alert and oriented to person, place, and time, Normal speech and language skills      Neurological Neurological Review of Symptoms: No symptoms reported    HEENT HEENT Symptoms Reported: No symptoms reported      Cardiovascular Cardiovascular Symptoms Reported: No symptoms reported    Respiratory Respiratory Symptoms Reported: No symptoms reported    Endocrine Endocrine Symptoms Reported: No symptoms reported Is patient diabetic?: Yes Is patient checking blood sugars at home?: Yes List most recent blood sugar readings, include date and time of day: Last sugar 110. Endocrine Self-Management Outcome: 4 (good)  Gastrointestinal Gastrointestinal Symptoms Reported: No symptoms reported      Genitourinary Genitourinary Symptoms Reported: No symptoms reported    Integumentary Integumentary Symptoms Reported: Incision, Other Other Integumentary Symptoms: Left arm scratch from recent fall.  Wife reports open to air and no signs of infection. Additional Integumentary Details: Incision to midback- wife reports incision clean, no redness or drainage. Skin  Management Strategies: Routine screening Skin Self-Management Outcome: 4 (good)  Musculoskeletal Musculoskelatal Symptoms Reviewed: Difficulty walking, Weakness, Unsteady gait Additional Musculoskeletal Details: Using walker Musculoskeletal Management Strategies: Exercise Musculoskeletal Self-Management Outcome: 4 (good) Falls in the past year?: Yes Number of falls in past year: 1 or less Was there an injury with Fall?: Yes (Left arm scratch per spouse) Fall Risk Category Calculator: 2 Patient Fall Risk Level: Moderate Fall Risk Patient at Risk for Falls Due to: History of fall(s), Impaired balance/gait  Psychosocial Psychosocial Symptoms Reported: Not assessed Additional Psychological Details: wife completes the assessment         There were no vitals filed for this visit.    Medications Reviewed Today     Reviewed by Kaelob Persky, RN (Case Manager) on 08/21/24 at 1112  Med List Status: <None>   Medication Order Taking? Sig Documenting Provider Last Dose Status Informant  acetaminophen  (TYLENOL ) 500 MG tablet [id number] Yes Take 1,000 mg by mouth every 6 (six) hours as needed for moderate pain (pain score 4-6). [provider]  Active Self  allopurinol  (ZYLOPRIM ) 300 MG tablet [id number] Yes Take 300 mg by mouth daily. [provider]  Active Self           Med Note [name]   Wed Jul 30, 2024 12:14 PM)    Cholecalciferol (VITAMIN D) 125 MCG CAPS [id number] Yes Take 5,000 Units by mouth at bedtime. [provider]  Active Self  clopidogrel  (PLAVIX ) 75 MG tablet [id number] Yes TAKE 1 TABLET BY MOUTH ONCE DAILY. McCue,  Harlene, NP  Active Self           Med Note [name]   Wed Jul 30, 2024 12:14 PM) ON HOLD  colchicine  0.6 MG tablet [id number]  One tab po q 2 hrs until pain relief or stomach upset  Patient not taking: Reported on 08/21/2024   [name], PA-C  Active Self           Med Note [name] [place] Sep 12, 2018 10:52 AM)  LF 01/23/18  Cyanocobalamin  (B-12) 2500 MCG TABS [id number] Yes Take 2,500 mcg by mouth daily. [provider]  Active Self  cyclobenzaprine  (FLEXERIL ) 10 MG tablet [id number] Yes Take 1 tablet (10 mg total) by mouth 3 (three) times daily as needed for muscle spasms. Darnella Dorn JONELLE, MD  Active   docusate sodium  (COLACE) 100 MG capsule [id number] Yes Take 1 capsule (100 mg total) by mouth 2 (two) times daily as needed for mild constipation (Take twice daily while taking narcotic pain medication). Darnella Dorn JONELLE, MD  Active   folic acid  (FOLVITE ) 1 MG tablet [id number] Yes Take 1 mg by mouth daily. [provider]  Active Self  hydroxychloroquine  (PLAQUENIL ) 200 MG tablet [id number] Yes Take 200 mg by mouth 2 (two) times daily. [provider]  Active Self           Med Note SOILA, LYLE BROCKS   Wed Jul 30, 2024 12:15 PM)    levETIRAcetam  (KEPPRA ) 500 MG tablet [id number] Yes Take 1 tablet (500 mg total) by mouth 2 (two) times daily. [name], MD  Active Self  metFORMIN (GLUCOPHAGE) 500 MG tablet [id number] Yes Take 500 mg by mouth 2 (two) times daily with a meal. [provider]  Active Self  methotrexate  (RHEUMATREX) 2.5 MG tablet [id number] Yes Take 20 mg by mouth every Friday. [provider]  Active Self  metoprolol  tartrate (LOPRESSOR ) 50 MG tablet [id number]  Take 25 mg by mouth 2 (two) times daily.  Patient not taking: Reported on 08/21/2024   [provider]  Active Self  oseltamivir  (TAMIFLU ) 75 MG capsule [id number]  Take 1 capsule (75 mg total) by mouth every 12 (twelve) hours.  Patient not taking: Reported on 07/30/2024   [name], PA-C  Active Self  oxyCODONE  (OXY IR/ROXICODONE ) 5 MG immediate release tablet [id number] Yes Take 1 tablet (5 mg total) by mouth every 6 (six) hours as needed for moderate pain (pain score 4-6). Darnella Dorn JONELLE, MD  Active   oxyCODONE  (ROXICODONE ) 5 MG immediate release tablet [id number]  Take  1 tablet (5 mg total) by mouth every 6 (six) hours as needed for severe pain (pain score 7-10). [name], MD  Active Self           Med Note [name]   Wed Jul 30, 2024 12:14 PM) For use post procedure  simvastatin  (ZOCOR ) 20 MG tablet [id number] Yes Take 1 tablet (20 mg total) by mouth at bedtime. Danford, Lonni SQUIBB, MD  Active Self            Goals Addressed             This Visit's Progress    VBCI Transitions of Care (TOC) Care Plan       Problems:  Recent Hospitalization for treatment of Elective Surgery-Thoracic Lumber Fusion- T12 unstable burst fracture Knowledge Deficit Related to Elective Surgery-Thoracic Lumber Fusion- T12 unstable burst fracture  Goal:  Over the next 30  days, the patient will not experience hospital readmission  Interventions:  Transitions of Care: 08-21-24 Spoke with spouse Romero, she states patient doing better.  Denies signs of infection to surgical site.  Recent fall on Sunday, only injury scratch to left arm.   Seen by PCP earlier in the week.  Using walker for ambulation and moving better and more.  Use of pain medication maybe once a day if that at night per spouse.    However, patient now is able to get up and walk with the walker.  Surgeon follow up 09-17-24.    Doctor Visits  - discussed the importance of doctor visits Reviewed Signs and symptoms of infection  Patient Self Care Activities:  Attend all scheduled provider appointments Call provider office for new concerns or questions  Notify RN Care Manager of TOC call rescheduling needs Participate in Transition of Care Program/Attend TOC scheduled calls Take medications as prescribed    Plan:  The patient has been provided with contact information for the care management team and has been advised to call with any health related questions or concerns.         Recommendation:   Continue Current Plan of Care  Follow Up Plan:   Telephone follow-up in 1  week  Isabele Lollar J. Gage Weant RN, MSN [facility], [facility] RN Care Manager Direct Dial: [phone]  Fax: [phone] Website: [email or web address]      "

## 2024-08-21 NOTE — Patient Instructions (Signed)
 Visit Information  Thank you for taking time to visit with me today. Please don't hesitate to contact me if I can be of assistance to you before our next scheduled telephone appointment.  Our next appointment is by telephone on 08/28/24 at 1100 am  Following is a copy of your care plan:   Goals Addressed             This Visit's Progress    VBCI Transitions of Care (TOC) Care Plan       Problems:  Recent Hospitalization for treatment of Elective Surgery-Thoracic Lumber Fusion- T12 unstable burst fracture Knowledge Deficit Related to Elective Surgery-Thoracic Lumber Fusion- T12 unstable burst fracture  Goal:  Over the next 30 days, the patient will not experience hospital readmission  Interventions:  Transitions of Care: 08-21-24 Spoke with spouse Romero, she states patient doing better.  Denies signs of infection to surgical site.  Recent fall on Sunday, only injury scratch to left arm.   Seen by PCP earlier in the week.  Using walker for ambulation and moving better and more.  Use of pain medication maybe once a day if that at night per spouse.    However, patient now is able to get up and walk with the walker.  Surgeon follow up 09-17-24.    Doctor Visits  - discussed the importance of doctor visits Reviewed Signs and symptoms of infection  Patient Self Care Activities:  Attend all scheduled provider appointments Call provider office for new concerns or questions  Notify RN Care Manager of TOC call rescheduling needs Participate in Transition of Care Program/Attend TOC scheduled calls Take medications as prescribed    Plan:  The patient has been provided with contact information for the care management team and has been advised to call with any health related questions or concerns.         Patient verbalizes understanding of instructions and care plan provided today and agrees to view in MyChart. Active MyChart status and patient understanding of how to access instructions and  care plan via MyChart confirmed with patient.     The patient has been provided with contact information for the care management team and has been advised to call with any health related questions or concerns.   Please call the care guide team at [phone] if you need to cancel or reschedule your appointment.   Please call the Suicide and Crisis Lifeline: 988 call the [place]  National Suicide Prevention Lifeline: (765)512-4096 or TTY: 581-492-8253 TTY 503-399-5592) to talk to a trained counselor if you are experiencing a Mental Health or Behavioral Health Crisis or need someone to talk to.  Mistie Adney J. Axel Meas RN, MSN [facility], [facility] RN Care Manager Direct Dial: [phone]  Fax: [phone] Website: [email or web address]

## 2024-08-28 ENCOUNTER — Other Ambulatory Visit: Payer: Self-pay

## 2024-08-28 NOTE — Patient Instructions (Signed)
 Visit Information  Thank you for taking time to visit with me today. Please don't hesitate to contact me if I can be of assistance to you before our next scheduled telephone appointment.  Our next appointment is by telephone on 09/04/24 at 1100 am  Following is a copy of your care plan:   Goals Addressed             This Visit's Progress    VBCI Transitions of Care (TOC) Care Plan       Problems:  Recent Hospitalization for treatment of Elective Surgery-Thoracic Lumber Fusion- T12 unstable burst fracture Knowledge Deficit Related to Elective Surgery-Thoracic Lumber Fusion- T12 unstable burst fracture  Goal:  Over the next 30 days, the patient will not experience hospital readmission  Interventions:  Transitions of Care: 08-28-24 Spoke with patient he reports doing well today. He denies pain and has not taken pain medications in a while.  He says wife reports that back incision is looking good.  No signs of infection.  Reviewed signs of infection.  He reports left arm scratch is healing.  He reports walking more and not using walker but does have nearby.  Discussed falls prevention.  Surgeon follow up 09-17-24.    Doctor Visits  - discussed the importance of doctor visits Reviewed Signs and symptoms of infection  Patient Self Care Activities:  Attend all scheduled provider appointments Call provider office for new concerns or questions  Notify RN Care Manager of TOC call rescheduling needs Participate in Transition of Care Program/Attend TOC scheduled calls Take medications as prescribed   Monitor surgical site for infection  Plan:  The patient has been provided with contact information for the care management team and has been advised to call with any health related questions or concerns.         Goals Addressed             This Visit's Progress    VBCI Transitions of Care (TOC) Care Plan       Problems:  Recent Hospitalization for treatment of Elective Surgery-Thoracic  Lumber Fusion- T12 unstable burst fracture Knowledge Deficit Related to Elective Surgery-Thoracic Lumber Fusion- T12 unstable burst fracture  Goal:  Over the next 30 days, the patient will not experience hospital readmission  Interventions:  Transitions of Care: 08-28-24 Spoke with patient he reports doing well today. He denies pain and has not taken pain medications in a while.  He says wife reports that back incision is looking good.  No signs of infection.  Reviewed signs of infection.  He reports left arm scratch is healing.  He reports walking more and not using walker but does have nearby.  Discussed falls prevention.  Surgeon follow up 09-17-24.    Doctor Visits  - discussed the importance of doctor visits Reviewed Signs and symptoms of infection  Patient Self Care Activities:  Attend all scheduled provider appointments Call provider office for new concerns or questions  Notify RN Care Manager of TOC call rescheduling needs Participate in Transition of Care Program/Attend TOC scheduled calls Take medications as prescribed   Monitor surgical site for infection  Plan:  The patient has been provided with contact information for the care management team and has been advised to call with any health related questions or concerns.         Patient verbalizes understanding of instructions and care plan provided today and agrees to view in MyChart. Active MyChart status and patient understanding of how to access instructions  and care plan via MyChart confirmed with patient.     The patient has been provided with contact information for the care management team and has been advised to call with any health related questions or concerns.   Please call the care guide team at [phone] if you need to cancel or reschedule your appointment.   Please call the Suicide and Crisis Lifeline: 988 if you are experiencing a Mental Health or Behavioral Health Crisis or need someone to talk  to.  Macaiah Mangal J. Kycen Spalla RN, MSN [facility], [facility] RN Care Manager Direct Dial: [phone]  Fax: [phone] Website: [email or web address]

## 2024-08-28 NOTE — Transitions of Care (Post Inpatient/ED Visit) (Signed)
 " Transition of Care week 3  Visit Note  08/28/2024  Name: Peter Martinez MRN: [id number]          DOB: [birth year]  Situation: Patient enrolled in [facility] 30-day program. Visit completed with patient by telephone.   Background:   Initial Transition Care Management Follow-up Telephone Call Discharge Date and Diagnosis: 08/06/24, Elective Surgery-Thoracic Lumber Fusion- T12 unstable burst fracture   Past Medical History:  Diagnosis Date   Anemia    Arthritis    psoriatic   Cancer (HCC)    prostate   Diabetes mellitus    Gout    H/O mixed connective tissue disease    History of kidney stones    Hypertension    Kidney stones    Mixed connective tissue disease    Psoriasis    Stroke (HCC) 09/19/2018   Wears glasses    Wears partial dentures    upper    Assessment: Patient Reported Symptoms: Cognitive        Neurological Neurological Review of Symptoms: No symptoms reported    HEENT HEENT Symptoms Reported: No symptoms reported      Cardiovascular Cardiovascular Symptoms Reported: No symptoms reported    Respiratory Respiratory Symptoms Reported: No symptoms reported    Endocrine Endocrine Symptoms Reported: No symptoms reported Is patient diabetic?: Yes Is patient checking blood sugars at home?: Yes List most recent blood sugar readings, include date and time of day: Reports sugars doing good.  Patient has not checked today. Endocrine Self-Management Outcome: 4 (good)  Gastrointestinal Gastrointestinal Symptoms Reported: No symptoms reported      Genitourinary Genitourinary Symptoms Reported: No symptoms reported    Integumentary Integumentary Symptoms Reported: Incision, Other Other Integumentary Symptoms: Left arm scratch healing per patient Additional Integumentary Details: Incision to midback- patient states wife reports incision clean, no redness or drainage. Skin Management Strategies: Routine screening Skin Self-Management Outcome: 4 (good)   Musculoskeletal Musculoskelatal Symptoms Reviewed: Weakness Musculoskeletal Management Strategies: Medical device Musculoskeletal Self-Management Outcome: 4 (good)      Psychosocial Psychosocial Symptoms Reported: No symptoms reported         There were no vitals filed for this visit. Pain Scale: 0-10 Pain Score: 0-No pain  Medications Reviewed Today     Reviewed by Alonte Wulff, RN (Case Manager) on 08/28/24 at 1114  Med List Status: <None>   Medication Order Taking? Sig Documenting Provider Last Dose Status Informant  acetaminophen  (TYLENOL ) 500 MG tablet [id number] Yes Take 1,000 mg by mouth every 6 (six) hours as needed for moderate pain (pain score 4-6). [provider]  Active Self  allopurinol  (ZYLOPRIM ) 300 MG tablet [id number] Yes Take 300 mg by mouth daily. [provider]  Active Self           Med Note [name]   Wed Jul 30, 2024 12:14 PM)    Cholecalciferol (VITAMIN D) 125 MCG CAPS [id number] Yes Take 5,000 Units by mouth at bedtime. [provider]  Active Self  clopidogrel  (PLAVIX ) 75 MG tablet [id number] Yes TAKE 1 TABLET BY MOUTH ONCE DAILY. Whitfield Raisin, NP  Active Self           Med Note [name]   Wed Jul 30, 2024 12:14 PM) ON HOLD  colchicine  0.6 MG tablet [id number]  One tab po q 2 hrs until pain relief or stomach upset  Patient not taking: Reported on 08/21/2024   [name], PA-C  Active Self  Med Note [name] [place] Sep 12, 2018 10:52 AM) LF 01/23/18  Cyanocobalamin  (B-12) 2500 MCG TABS [id number] Yes Take 2,500 mcg by mouth daily. [provider]  Active Self  cyclobenzaprine  (FLEXERIL ) 10 MG tablet [id number] Yes Take 1 tablet (10 mg total) by mouth 3 (three) times daily as needed for muscle spasms. Darnella Dorn JONELLE, MD  Active   docusate sodium  (COLACE) 100 MG capsule [id number] Yes Take 1 capsule (100 mg total) by mouth 2 (two) times daily as needed for mild constipation (Take twice  daily while taking narcotic pain medication). Darnella Dorn JONELLE, MD  Active   folic acid  (FOLVITE ) 1 MG tablet [id number] Yes Take 1 mg by mouth daily. [provider]  Active Self  hydroxychloroquine  (PLAQUENIL ) 200 MG tablet [id number] Yes Take 200 mg by mouth 2 (two) times daily. [provider]  Active Self           Med Note SOILA, LYLE BROCKS   Wed Jul 30, 2024 12:15 PM)    levETIRAcetam  (KEPPRA ) 500 MG tablet [id number] Yes Take 1 tablet (500 mg total) by mouth 2 (two) times daily. [name], MD  Active Self  metFORMIN (GLUCOPHAGE) 500 MG tablet [id number] Yes Take 500 mg by mouth 2 (two) times daily with a meal. [provider]  Active Self  methotrexate  (RHEUMATREX) 2.5 MG tablet [id number] Yes Take 20 mg by mouth every Friday. [provider]  Active Self  metoprolol  tartrate (LOPRESSOR ) 50 MG tablet [id number]  Take 25 mg by mouth 2 (two) times daily.  Patient not taking: Reported on 08/28/2024   [provider]  Active Self  oseltamivir  (TAMIFLU ) 75 MG capsule [id number]  Take 1 capsule (75 mg total) by mouth every 12 (twelve) hours.  Patient not taking: Reported on 07/30/2024   [name], PA-C  Active Self  oxyCODONE  (OXY IR/ROXICODONE ) 5 MG immediate release tablet [id number] Yes Take 1 tablet (5 mg total) by mouth every 6 (six) hours as needed for moderate pain (pain score 4-6). Darnella Dorn JONELLE, MD  Active   oxyCODONE  (ROXICODONE ) 5 MG immediate release tablet [id number]  Take 1 tablet (5 mg total) by mouth every 6 (six) hours as needed for severe pain (pain score 7-10). [name], MD  Active Self           Med Note [name]   Wed Jul 30, 2024 12:14 PM) For use post procedure  simvastatin  (ZOCOR ) 20 MG tablet [id number] Yes Take 1 tablet (20 mg total) by mouth at bedtime. Danford, Lonni SQUIBB, MD  Active Self            Goals Addressed             This Visit's Progress    VBCI Transitions of Care  (TOC) Care Plan       Problems:  Recent Hospitalization for treatment of Elective Surgery-Thoracic Lumber Fusion- T12 unstable burst fracture Knowledge Deficit Related to Elective Surgery-Thoracic Lumber Fusion- T12 unstable burst fracture  Goal:  Over the next 30 days, the patient will not experience hospital readmission  Interventions:  Transitions of Care: 08-28-24 Spoke with patient he reports doing well today. He denies pain and has not taken pain medications in a while.  He says wife reports that back incision is looking good.  No signs of infection.  Reviewed signs of infection.  He reports left arm scratch is healing.  He reports walking more and not using walker but  does have nearby.  Discussed falls prevention.  Surgeon follow up 09-17-24.    Doctor Visits  - discussed the importance of doctor visits Reviewed Signs and symptoms of infection  Patient Self Care Activities:  Attend all scheduled provider appointments Call provider office for new concerns or questions  Notify RN Care Manager of TOC call rescheduling needs Participate in Transition of Care Program/Attend TOC scheduled calls Take medications as prescribed   Monitor surgical site for infection  Plan:  The patient has been provided with contact information for the care management team and has been advised to call with any health related questions or concerns.         Recommendation:   Continue Current Plan of Care  Follow Up Plan:   Telephone follow-up in 1 week  Malory Spurr J. Monti Villers RN, MSN [facility], [facility] Health RN Care Manager Direct Dial: [phone]  Fax: [phone] Website: [email or web address]      "

## 2024-09-01 ENCOUNTER — Telehealth: Admitting: Licensed Clinical Social Worker

## 2024-09-04 ENCOUNTER — Other Ambulatory Visit: Payer: Self-pay

## 2024-09-04 NOTE — Patient Instructions (Signed)
 Visit Information  Thank you for taking time to visit with me today. Please don't hesitate to contact me if I can be of assistance to you before our next scheduled telephone appointment.  Our next appointment is by telephone on 09/11/24 at 1100  Following is a copy of your care plan:   Goals Addressed             This Visit's Progress    VBCI Transitions of Care (TOC) Care Plan       Problems:  Recent Hospitalization for treatment of Elective Surgery-Thoracic Lumber Fusion- T12 unstable burst fracture Knowledge Deficit Related to Elective Surgery-Thoracic Lumber Fusion- T12 unstable burst fracture  Goal:  Over the next 30 days, the patient will not experience hospital readmission  Interventions:  Transitions of Care: 09-04-24 Spoke with patient he reports doing okay today. He denies pain and has not taken pain medications in a while.  He says wife reports that back incision is looking good.  No signs of infection.  Reiterated signs of infection.  He reports left arm scratch is healing.  Surgeon follow up 09-17-24.    Doctor Visits  - discussed the importance of doctor visits Reviewed Signs and symptoms of infection  Patient Self Care Activities:  Attend all scheduled provider appointments Call provider office for new concerns or questions  Notify RN Care Manager of TOC call rescheduling needs Participate in Transition of Care Program/Attend TOC scheduled calls Take medications as prescribed   Monitor surgical site for infection  Plan:  The patient has been provided with contact information for the care management team and has been advised to call with any health related questions or concerns.         Patient verbalizes understanding of instructions and care plan provided today and agrees to view in MyChart. Active MyChart status and patient understanding of how to access instructions and care plan via MyChart confirmed with patient.     The patient has been provided with  contact information for the care management team and has been advised to call with any health related questions or concerns.   Please call the care guide team at [phone] if you need to cancel or reschedule your appointment.   Please call the Suicide and Crisis Lifeline: 988 if you are experiencing a Mental Health or Behavioral Health Crisis or need someone to talk to.  Wheeler Incorvaia J. Turner Baillie RN, MSN [facility], [facility] RN Care Manager Direct Dial: [phone]  Fax: [phone] Website: [email or web address]

## 2024-09-04 NOTE — Transitions of Care (Post Inpatient/ED Visit) (Signed)
 " Transition of Care week 4  Visit Note  09/04/2024  Name: Peter Martinez MRN: [id number]          DOB: July 19, 1951  Situation: Patient enrolled in [facility] 30-day program. Visit completed with patient by telephone.   Background:   Initial Transition Care Management Follow-up Telephone Call Discharge Date and Diagnosis: 08/06/24, Elective Surgery-Thoracic Lumber Fusion- T12 unstable burst fracture   Past Medical History:  Diagnosis Date   Anemia    Arthritis    psoriatic   Cancer (HCC)    prostate   Diabetes mellitus    Gout    H/O mixed connective tissue disease    History of kidney stones    Hypertension    Kidney stones    Mixed connective tissue disease    Psoriasis    Stroke (HCC) 09/19/2018   Wears glasses    Wears partial dentures    upper    Assessment: Patient Reported Symptoms: Cognitive Cognitive Status: Alert and oriented to person, place, and time, Normal speech and language skills, Insightful and able to interpret abstract concepts      Neurological Neurological Review of Symptoms: No symptoms reported    HEENT HEENT Symptoms Reported: No symptoms reported      Cardiovascular Cardiovascular Symptoms Reported: No symptoms reported    Respiratory Respiratory Symptoms Reported: No symptoms reported    Endocrine Endocrine Symptoms Reported: No symptoms reported Is patient diabetic?: Yes Is patient checking blood sugars at home?: Yes List most recent blood sugar readings, include date and time of day: Blood sugar 104 this am    Gastrointestinal Gastrointestinal Symptoms Reported: No symptoms reported      Genitourinary Genitourinary Symptoms Reported: No symptoms reported    Integumentary Integumentary Symptoms Reported: Incision Other Integumentary Symptoms: Left arm scratch healing per patient Additional Integumentary Details: Incision to midback- patient states wife reports incision clean, no redness or drainage.    Musculoskeletal Musculoskelatal  Symptoms Reviewed: No symptoms reported        Psychosocial Psychosocial Symptoms Reported: No symptoms reported         There were no vitals filed for this visit. Pain Scale: 0-10 Pain Score: 0-No pain  Medications Reviewed Today     Reviewed by Derward Marple, RN (Case Manager) on 09/04/24 at 1150  Med List Status: <None>   Medication Order Taking? Sig Documenting Provider Last Dose Status Informant  acetaminophen  (TYLENOL ) 500 MG tablet [id number] Yes Take 1,000 mg by mouth every 6 (six) hours as needed for moderate pain (pain score 4-6). [provider]  Active Self  allopurinol  (ZYLOPRIM ) 300 MG tablet [id number] Yes Take 300 mg by mouth daily. [provider]  Active Self           Med Note [name]   Wed Jul 30, 2024 12:14 PM)    Cholecalciferol (VITAMIN D) 125 MCG CAPS [id number] Yes Take 5,000 Units by mouth at bedtime. [provider]  Active Self  clopidogrel  (PLAVIX ) 75 MG tablet [id number] Yes TAKE 1 TABLET BY MOUTH ONCE DAILY. Whitfield Raisin, NP  Active Self           Med Note [name]   Wed Jul 30, 2024 12:14 PM) ON HOLD  colchicine  0.6 MG tablet [id number]  One tab po q 2 hrs until pain relief or stomach upset  Patient not taking: Reported on 09/04/2024   [name], PA-C  Active Self           Med  Note [name] [place] Sep 12, 2018 10:52 AM) LF 01/23/18  Cyanocobalamin  (B-12) 2500 MCG TABS [id number] Yes Take 2,500 mcg by mouth daily. [provider]  Active Self  cyclobenzaprine  (FLEXERIL ) 10 MG tablet [id number] Yes Take 1 tablet (10 mg total) by mouth 3 (three) times daily as needed for muscle spasms. Darnella Dorn JONELLE, MD  Active   docusate sodium  (COLACE) 100 MG capsule [id number] Yes Take 1 capsule (100 mg total) by mouth 2 (two) times daily as needed for mild constipation (Take twice daily while taking narcotic pain medication). Darnella Dorn JONELLE, MD  Active   folic acid  (FOLVITE ) 1 MG tablet [id number] Yes  Take 1 mg by mouth daily. [provider]  Active Self  hydroxychloroquine  (PLAQUENIL ) 200 MG tablet [id number] Yes Take 200 mg by mouth 2 (two) times daily. [provider]  Active Self           Med Note SOILA, LYLE BROCKS   Wed Jul 30, 2024 12:15 PM)    levETIRAcetam  (KEPPRA ) 500 MG tablet [id number] Yes Take 1 tablet (500 mg total) by mouth 2 (two) times daily. [name], MD  Active Self  metFORMIN (GLUCOPHAGE) 500 MG tablet [id number] Yes Take 500 mg by mouth 2 (two) times daily with a meal. [provider]  Active Self  methotrexate  (RHEUMATREX) 2.5 MG tablet [id number] Yes Take 20 mg by mouth every Friday. [provider]  Active Self  metoprolol  tartrate (LOPRESSOR ) 50 MG tablet [id number] Yes Take 25 mg by mouth 2 (two) times daily. [provider]  Active Self  oseltamivir  (TAMIFLU ) 75 MG capsule [id number]  Take 1 capsule (75 mg total) by mouth every 12 (twelve) hours.  Patient not taking: Reported on 09/04/2024   [name], PA-C  Active Self  oxyCODONE  (OXY IR/ROXICODONE ) 5 MG immediate release tablet [id number] Yes Take 1 tablet (5 mg total) by mouth every 6 (six) hours as needed for moderate pain (pain score 4-6). Darnella Dorn JONELLE, MD  Active   oxyCODONE  (ROXICODONE ) 5 MG immediate release tablet [id number]  Take 1 tablet (5 mg total) by mouth every 6 (six) hours as needed for severe pain (pain score 7-10). [name], MD  Active Self           Med Note [name]   Wed Jul 30, 2024 12:14 PM) For use post procedure  simvastatin  (ZOCOR ) 20 MG tablet [id number] Yes Take 1 tablet (20 mg total) by mouth at bedtime. Danford, Lonni SQUIBB, MD  Active Self            Goals Addressed             This Visit's Progress    VBCI Transitions of Care (TOC) Care Plan       Problems:  Recent Hospitalization for treatment of Elective Surgery-Thoracic Lumber Fusion- T12 unstable burst fracture Knowledge Deficit Related  to Elective Surgery-Thoracic Lumber Fusion- T12 unstable burst fracture  Goal:  Over the next 30 days, the patient will not experience hospital readmission  Interventions:  Transitions of Care: 09-04-24 Spoke with patient he reports doing okay today. He denies pain and has not taken pain medications in a while.  He says wife reports that back incision is looking good.  No signs of infection.  Reiterated signs of infection.  He reports left arm scratch is healing.  Surgeon follow up 09-17-24.    Doctor Visits  - discussed the importance of doctor visits Reviewed Signs  and symptoms of infection  Patient Self Care Activities:  Attend all scheduled provider appointments Call provider office for new concerns or questions  Notify RN Care Manager of TOC call rescheduling needs Participate in Transition of Care Program/Attend TOC scheduled calls Take medications as prescribed   Monitor surgical site for infection  Plan:  The patient has been provided with contact information for the care management team and has been advised to call with any health related questions or concerns.         Recommendation:   Continue Current Plan of Care  Follow Up Plan:   Telephone follow-up in 1 week  Lyle Niblett J. Eldean Klatt RN, MSN [facility], [facility] RN Care Manager Direct Dial: [phone]  Fax: [phone] Website: [email or web address]      "

## 2024-09-11 ENCOUNTER — Telehealth: Payer: Self-pay

## 2024-09-11 ENCOUNTER — Other Ambulatory Visit: Payer: Self-pay

## 2024-09-11 DIAGNOSIS — E119 Type 2 diabetes mellitus without complications: Secondary | ICD-10-CM

## 2024-09-11 NOTE — Transitions of Care (Post Inpatient/ED Visit) (Signed)
 " Transition of Care Week 5  Visit Note  09/11/2024  Name: Peter Martinez MRN: [id number]          DOB: [birth year]  Situation: Patient enrolled in [facility] 30-day program. Visit completed with patient by telephone.   Background:   Initial Transition Care Management Follow-up Telephone Call Discharge Date and Diagnosis: 08/06/24, Elective Surgery-Thoracic Lumber Fusion- T12 unstable burst fracture   Past Medical History:  Diagnosis Date   Anemia    Arthritis    psoriatic   Cancer (HCC)    prostate   Diabetes mellitus    Gout    H/O mixed connective tissue disease    History of kidney stones    Hypertension    Kidney stones    Mixed connective tissue disease    Psoriasis    Stroke (HCC) 09/19/2018   Wears glasses    Wears partial dentures    upper    Assessment: Patient Reported Symptoms: Cognitive Cognitive Status: Alert and oriented to person, place, and time, Normal speech and language skills      Neurological Neurological Review of Symptoms: No symptoms reported    HEENT HEENT Symptoms Reported: No symptoms reported      Cardiovascular Cardiovascular Symptoms Reported: Fatigue, Other:    Respiratory Respiratory Symptoms Reported: No symptoms reported    Endocrine Endocrine Symptoms Reported: No symptoms reported Is patient diabetic?: Yes Is patient checking blood sugars at home?: Yes List most recent blood sugar readings, include date and time of day: this morning fasting BG 108    Gastrointestinal Gastrointestinal Symptoms Reported: No symptoms reported      Genitourinary Genitourinary Symptoms Reported: No symptoms reported    Integumentary Integumentary Symptoms Reported: Incision Other Integumentary Symptoms: left arm scratch much better; back incision without any concerns    Musculoskeletal Musculoskelatal Symptoms Reviewed: Weakness Additional Musculoskeletal Details: Patient reports low stamina;  leg strength ok        Psychosocial Psychosocial  Symptoms Reported: No symptoms reported Additional Psychological Details: Pt wants to get back to contributing in his household         There were no vitals filed for this visit. Pain Scale: 0-10 Pain Score: 0-No pain  Medications Reviewed Today     Reviewed by Elaine Almarie LABOR, RN (Registered Nurse) on 09/11/24 at 1146  Med List Status: <None>   Medication Order Taking? Sig Documenting Provider Last Dose Status Informant  acetaminophen  (TYLENOL ) 500 MG tablet [id number] Yes Take 1,000 mg by mouth every 6 (six) hours as needed for moderate pain (pain score 4-6). [provider]  Active Self  allopurinol  (ZYLOPRIM ) 300 MG tablet [id number] Yes Take 300 mg by mouth daily. [provider]  Active Self           Med Note [name]   Wed Jul 30, 2024 12:14 PM)    Cholecalciferol (VITAMIN D) 125 MCG CAPS [id number] Yes Take 5,000 Units by mouth at bedtime. [provider]  Active Self  clopidogrel  (PLAVIX ) 75 MG tablet [id number] Yes TAKE 1 TABLET BY MOUTH ONCE DAILY. Whitfield Raisin, NP  Active Self           Med Note [name]   Wed Jul 30, 2024 12:14 PM) ON HOLD  colchicine  0.6 MG tablet [id number]  One tab po q 2 hrs until pain relief or stomach upset  Patient not taking: Reported on 09/11/2024   [name], PA-C  Active Self  Med Note [name] [place] Sep 12, 2018 10:52 AM) LF 01/23/18  Cyanocobalamin  (B-12) 2500 MCG TABS [id number] Yes Take 2,500 mcg by mouth daily. [provider]  Active Self  cyclobenzaprine  (FLEXERIL ) 10 MG tablet [id number] Yes Take 1 tablet (10 mg total) by mouth 3 (three) times daily as needed for muscle spasms. Darnella Dorn JONELLE, MD  Active   docusate sodium  (COLACE) 100 MG capsule [id number] Yes Take 1 capsule (100 mg total) by mouth 2 (two) times daily as needed for mild constipation (Take twice daily while taking narcotic pain medication). Darnella Dorn JONELLE, MD  Active   folic acid  (FOLVITE )  1 MG tablet [id number] Yes Take 1 mg by mouth daily. [provider]  Active Self  hydroxychloroquine  (PLAQUENIL ) 200 MG tablet [id number] Yes Take 200 mg by mouth 2 (two) times daily. [provider]  Active Self           Med Note SOILA, LYLE BROCKS   Wed Jul 30, 2024 12:15 PM)    levETIRAcetam  (KEPPRA ) 500 MG tablet [id number] Yes Take 1 tablet (500 mg total) by mouth 2 (two) times daily. [name], MD  Active Self  metFORMIN (GLUCOPHAGE) 500 MG tablet [id number] Yes Take 500 mg by mouth 2 (two) times daily with a meal. [provider]  Active Self  methotrexate  (RHEUMATREX) 2.5 MG tablet [id number] Yes Take 20 mg by mouth every Friday. [provider]  Active Self  metoprolol  tartrate (LOPRESSOR ) 50 MG tablet [id number]  Take 25 mg by mouth 2 (two) times daily.  Patient not taking: Reported on 09/11/2024   [provider]  Active Self  oseltamivir  (TAMIFLU ) 75 MG capsule [id number]  Take 1 capsule (75 mg total) by mouth every 12 (twelve) hours.  Patient not taking: Reported on 09/11/2024   [name], PA-C  Active Self  oxyCODONE  (OXY IR/ROXICODONE ) 5 MG immediate release tablet [id number] Yes Take 1 tablet (5 mg total) by mouth every 6 (six) hours as needed for moderate pain (pain score 4-6). Darnella Dorn JONELLE, MD  Active   oxyCODONE  (ROXICODONE ) 5 MG immediate release tablet [id number]  Take 1 tablet (5 mg total) by mouth every 6 (six) hours as needed for severe pain (pain score 7-10).  Patient not taking: Reported on 09/11/2024   [name], MD  Active Self           Med Note [name]   Wed Jul 30, 2024 12:14 PM) For use post procedure  simvastatin  (ZOCOR ) 20 MG tablet [id number] Yes Take 1 tablet (20 mg total) by mouth at bedtime. Danford, Lonni SQUIBB, MD  Active Self            Goals Addressed             This Visit's Progress    COMPLETED: VBCI Transitions of Care (TOC) Care Plan       Problems:  Recent  Hospitalization for treatment of Elective Surgery-Thoracic Lumber Fusion- T12 unstable burst fracture Knowledge Deficit Related to Elective Surgery-Thoracic Lumber Fusion- T12 unstable burst fracture  Goal:  Over the next 30 days, the patient will not experience hospital readmission  Interventions:  09-11-24 Spoke with patient he reports doing well  today, just still trying to improve his stamina with walking and housework. He denies pain and has not taken pain medications in awhile.  He says wife reports that back incision is looking good, without signs of infection.   He reports left arm  scratch is healing.  He plans to attend appointment with Surgeon on 09-17-24.   I encouraged him to walk around the house as much as possible, and to be extremely careful if he walks outside due to snow and ice. Discussed working with a Proofreader for education and support related to diabetes and post-surgical rehab.  Patient agreed to continue receiving Nurse Care Management services.    Doctor Visits  - discussed the importance of doctor visits Reviewed Signs and symptoms of infection  Patient Self Care Activities:  Attend all scheduled provider appointments Call provider office for new concerns or questions  Take medications as prescribed   Increase exercise and housework as tolerated  Plan:  Referral to CCM was placed for continued education and support           Recommendation:   Referral to: CCM Nurse Program Continue Current Plan of Care  Follow Up Plan:   Referral to RN Case Manager Closing From:  Transitions of Care Program  Almarie Shams, RN [place] / [facility] Population Health RN Care Manager / Transition of Care Direct Dial: [phone]  With Dionne Leath, RN [place] / [facility] Health RN Care Manager / Transition of Care     "

## 2024-09-11 NOTE — Progress Notes (Signed)
 Complex Care Management Note  Care Guide Note 09/11/2024 Name: Peter Martinez MRN: [id number] DOB: 10/16/1950  Peter Martinez is a 74 y.o. year old male who sees Sheryle Carwin, MD for primary care. I reached out to [employer] by phone today to offer complex care management services.  Peter Martinez was given information about Complex Care Management services today including:   The Complex Care Management services include support from the care team which includes your Nurse Care Manager, Clinical Social Worker, or Pharmacist.  The Complex Care Management team is here to help remove barriers to the health concerns and goals most important to you. Complex Care Management services are voluntary, and the patient may decline or stop services at any time by request to their care team member.   Complex Care Management Consent Status: Patient agreed to services and verbal consent obtained.   Follow up plan:  Telephone appointment with complex care management team member scheduled for:  09/18/24 @ 9 AM  Encounter Outcome:  Patient Scheduled  Leotis Rase [facility], [facility] Guide  Direct Dial: [phone]  Fax [phone]

## 2024-09-11 NOTE — Patient Instructions (Signed)
 Visit Information  Thank you for taking time to visit with me today. Please don't hesitate to contact me if I can be of assistance to you.   Following is a copy of your care plan:   Goals Addressed             This Visit's Progress    COMPLETED: VBCI Transitions of Care (TOC) Care Plan       Problems:  Recent Hospitalization for treatment of Elective Surgery-Thoracic Lumber Fusion- T12 unstable burst fracture Knowledge Deficit Related to Elective Surgery-Thoracic Lumber Fusion- T12 unstable burst fracture  Goal:  Over the next 30 days, the patient will not experience hospital readmission  Interventions:  09-11-24 Spoke with patient he reports doing well  today, just still trying to improve his stamina with walking and housework. He denies pain and has not taken pain medications in awhile.  He says wife reports that back incision is looking good, without signs of infection.   He reports left arm scratch is healing.  He plans to attend appointment with Surgeon on 09-17-24.   I encouraged him to walk around the house as much as possible, and to be extremely careful if he walks outside due to snow and ice. Discussed working with a Proofreader for education and support related to diabetes and post-surgical rehab.  Patient agreed to continue receiving Nurse Care Management services.    Doctor Visits  - discussed the importance of doctor visits Reviewed Signs and symptoms of infection  Patient Self Care Activities:  Attend all scheduled provider appointments Call provider office for new concerns or questions  Take medications as prescribed   Increase exercise and housework as tolerated  Plan:  Referral to CCM was placed for continued education and support          Patient verbalizes understanding of instructions and care plan provided today and agrees to view in MyChart. Active MyChart status and patient understanding of how to access instructions and care plan via MyChart  confirmed with patient.     No further follow up required: Referring over to CCM Nurse Program  Please call the care guide team at [phone] if you need to cancel or reschedule your appointment.   Please call the Suicide and Crisis Lifeline: 988 if you are experiencing a Mental Health or Behavioral Health Crisis or need someone to talk to.  Almarie Shams, RN [place] / [facility] Health RN Care Manager / Transition of Care Direct Dial: [phone]  With Dionne Leath, RN [place] / [facility] Health RN Care Manager / Transition of Care

## 2024-09-12 ENCOUNTER — Ambulatory Visit: Admitting: Gastroenterology

## 2024-09-15 ENCOUNTER — Other Ambulatory Visit: Payer: Self-pay | Admitting: Licensed Clinical Social Worker

## 2024-09-15 ENCOUNTER — Ambulatory Visit: Admitting: Gastroenterology

## 2024-09-16 NOTE — Patient Instructions (Signed)
 Visit Information  Thank you for taking time to visit with me today. Please don't hesitate to contact me if I can be of assistance to you before our next scheduled appointment.  Our next appointment is by telephone on 10/01/23 at 130 Please call the care guide team at [phone] if you need to cancel or reschedule your appointment.   Following is a copy of your care plan:   Goals Addressed             This Visit's Progress    VBCI Social Work Care Plan       Problems:  Recent Hospitalization for treatment of Elective Surgery-Thoracic Lumber Fusion- T12 unstable burst fracture Knowledge Deficit Related to Elective Surgery-Thoracic Lumber Fusion- T12 unstable burst fracture Stress from chronic health concerns and inability to provide for household due to being out of work   Clinical Goals: Patient will work with agencies/resources/PCP/VBCI Care team to address community resource needs and to help promote the safety and health of patient.  Interventions:  Spoke with patient he reports doing okay today. He denies pain and has not taken pain medications in a while.  He says wife reports that back incision is looking good.  No signs of infection.  Reiterated signs of infection.  He reports left arm scratch is healing.  Surgeon follow up 09-17-24. Doctor Visits  - discussed the importance of doctor visits Reviewed Signs and symptoms of infection Inter-disciplinary care team collaboration (see longitudinal plan of care) Patient provided patient history.  Assessment of needs, progress, barriers completed. Advised patient to answer all calls from care providers and to keep phone nearby. Advised parents to contact 911 or 988 if a crisis occurs.  Clinical interventions provided:Solution-Focused Strategies, Active listening / Reflection utilized , Problem Solving /Task Center ,  Patient's main support network includes spouse  Patient Goals/Self-Care Activities: Call provider office for new  concerns or questions Consider bumping up self-care  Call [phone] in case of an emergency  Patient Goals/Self-Care Activities:  Increase coping skills, healthy habits, self-management skills, and stress reduction  Plan:   The care management team will reach out to the patient again over the next 30 days.        Please call the Suicide and Crisis Lifeline: 988 call the [place]  National Suicide Prevention Lifeline: (951)208-8449 or TTY: 401 368 3358 TTY 281-030-7384) to talk to a trained counselor call 1-800-273-TALK (toll free, 24 hour hotline) go to [facility] [address], [place] [phone]) call the [facility] Crisis Line: [phone] call 911 if you are experiencing a Mental Health or Behavioral Health Crisis or need someone to talk to.  Patient verbalized understanding of Care plan and visit instructions communicated this visit  Lyle Rung, BSW, MSW, LCSW Licensed Clinical Social Worker [employer]   [email or web address] Direct Dial: [phone]

## 2024-09-18 ENCOUNTER — Other Ambulatory Visit: Payer: Self-pay

## 2024-09-18 NOTE — Patient Outreach (Signed)
 Complex Care Management   Visit Note  09/18/2024  Name:  Peter Martinez MRN: [id number] DOB: [birth year]  Situation: Referral received for Complex Care Management related to DMII. I obtained verbal consent from Patient.  Visit completed with Patient  on the phone  Background:   Past Medical History:  Diagnosis Date   Anemia    Arthritis    psoriatic   Cancer (HCC)    prostate   Diabetes mellitus    Gout    H/O mixed connective tissue disease    History of kidney stones    Hypertension    Kidney stones    Mixed connective tissue disease    Psoriasis    Stroke (HCC) 09/19/2018   Wears glasses    Wears partial dentures    upper    Assessment: Patient reports that he has been checking his BS weekly and ranges remain 100-110. States A1C has also remained below 6.  Patient Reported Symptoms:  Cognitive Cognitive Status: No symptoms reported, Alert and oriented to person, place, and time, Normal speech and language skills Cognitive/Intellectual Conditions Management [RPT]: None reported or documented in medical history or problem list   Health Maintenance Behaviors: None Healing Pattern: Slow Health Facilitated by: Healthy diet, Rest, Stress management  Neurological Neurological Review of Symptoms: Vision changes (Feels like vision has gotten a little worse- has upcoming eye exam in a week.) Neurological Management Strategies: Routine screening Neurological Self-Management Outcome: 4 (good)  HEENT HEENT Symptoms Reported: No symptoms reported HEENT Management Strategies: Routine screening HEENT Self-Management Outcome: 4 (good)    Cardiovascular Cardiovascular Symptoms Reported: Swelling in legs or feet (mild swelling in right ankle. Patient states it happens occasionally with increased walking.) Does patient have uncontrolled Hypertension?: No Cardiovascular Management Strategies: Routine screening Cardiovascular Self-Management Outcome: 4 (good)  Respiratory Respiratory  Symptoms Reported: No symptoms reported Respiratory Management Strategies: Adequate rest Respiratory Self-Management Outcome: 4 (good)  Endocrine Is patient diabetic?: Yes Is patient checking blood sugars at home?: Yes List most recent blood sugar readings, include date and time of day: Checks BS weekly- last reading was 108 Endocrine Self-Management Outcome: 4 (good)  Gastrointestinal Gastrointestinal Symptoms Reported: No symptoms reported Gastrointestinal Management Strategies: Medication therapy, Adequate rest Gastrointestinal Self-Management Outcome: 4 (good)    Genitourinary Genitourinary Symptoms Reported: Frequency Additional Genitourinary Details: has hx of prostate cancer and has had frequency since. No concerns with this. Genitourinary Management Strategies: Adequate rest Genitourinary Self-Management Outcome: 4 (good)  Integumentary Integumentary Symptoms Reported: Other Other Integumentary Symptoms: psoriasis on top of head- taking medication. Additional Integumentary Details: incision to back has healed well. Skin Management Strategies: Routine screening, Adequate rest Skin Self-Management Outcome: 4 (good)  Musculoskeletal Musculoskelatal Symptoms Reviewed: Weakness Other Musculoskeletal Symptoms: Becomes weak with walking- trying to gradually increase the distance each day to improve stamina. Musculoskeletal Management Strategies: Medical device, Routine screening Musculoskeletal Self-Management Outcome: 4 (good) Falls in the past year?: Yes Number of falls in past year: 1 or less Was there an injury with Fall?: No Fall Risk Category Calculator: 1 Patient Fall Risk Level: Low Fall Risk Patient at Risk for Falls Due to: History of fall(s) Fall risk Follow up: Falls prevention discussed  Psychosocial Psychosocial Symptoms Reported: No symptoms reported Other Psychosocial Conditions: Financial concerns with not being able to work d/t recent surgery Behavioral Management  Strategies: Support system, Coping strategies Behavioral Health Self-Management Outcome: 4 (good) Major Change/Loss/Stressor/Fears (CP): Medical condition, self Techniques to Cope with Loss/Stress/Change: Diversional activities Quality of Family Relationships: involved, helpful,  supportive Do you feel physically threatened by others?: No    09/18/2024    PHQ2-9 Depression Screening   Little interest or pleasure in doing things Not at all  Feeling down, depressed, or hopeless Not at all  PHQ-2 - Total Score 0  Trouble falling or staying asleep, or sleeping too much    Feeling tired or having little energy    Poor appetite or overeating     Feeling bad about yourself - or that you are a failure or have let yourself or your family down    Trouble concentrating on things, such as reading the newspaper or watching television    Moving or speaking so slowly that other people could have noticed.  Or the opposite - being so fidgety or restless that you have been moving around a lot more than usual    Thoughts that you would be better off dead, or hurting yourself in some way    PHQ2-9 Total Score    If you checked off any problems, how difficult have these problems made it for you to do your work, take care of things at home, or get along with other people    Depression Interventions/Treatment      Today's Vitals   09/18/24 0916  BP: 122/84   Pain Scale: 0-10 Pain Score: 0-No pain  Medications Reviewed Today     Reviewed by Leodis Warren DEL, RN (Registered Nurse) on 09/18/24 at [phone]  Med List Status: <None>   Medication Order Taking? Sig Documenting Provider Last Dose Status Informant  acetaminophen  (TYLENOL ) 500 MG tablet [id number] Yes Take 1,000 mg by mouth every 6 (six) hours as needed for moderate pain (pain score 4-6). [provider]  Active Self  allopurinol  (ZYLOPRIM ) 300 MG tablet [id number] Yes Take 300 mg by mouth daily. [provider]  Active Self           Med  Note [name]   Wed Jul 30, 2024 12:14 PM)    Cholecalciferol (VITAMIN D) 125 MCG CAPS [id number] Yes Take 5,000 Units by mouth at bedtime. [provider]  Active Self  clopidogrel  (PLAVIX ) 75 MG tablet [id number] Yes TAKE 1 TABLET BY MOUTH ONCE DAILY. Whitfield Raisin, NP  Active Self           Med Note [name]   Wed Jul 30, 2024 12:14 PM) ON HOLD  colchicine  0.6 MG tablet [id number]  One tab po q 2 hrs until pain relief or stomach upset  Patient not taking: Reported on 09/18/2024   [name], PA-C  Active Self           Med Note [name] [place] Sep 12, 2018 10:52 AM) LF 01/23/18  Cyanocobalamin  (B-12) 2500 MCG TABS [id number] Yes Take 2,500 mcg by mouth daily. [provider]  Active Self  cyclobenzaprine  (FLEXERIL ) 10 MG tablet [id number]  Take 1 tablet (10 mg total) by mouth 3 (three) times daily as needed for muscle spasms.  Patient not taking: Reported on 09/18/2024   Darnella Dorn JONELLE, MD  Active   docusate sodium  (COLACE) 100 MG capsule [id number]  Take 1 capsule (100 mg total) by mouth 2 (two) times daily as needed for mild constipation (Take twice daily while taking narcotic pain medication).  Patient not taking: Reported on 09/18/2024   Darnella Dorn JONELLE, MD  Active   folic acid  (FOLVITE ) 1 MG tablet [id number] Yes Take 1 mg by mouth daily. [provider]  Active Self  hydroxychloroquine  (PLAQUENIL ) 200 MG tablet [id number] Yes Take 200 mg by mouth 2 (two) times daily. [provider]  Active Self           Med Note SOILA, LYLE BROCKS   Wed Jul 30, 2024 12:15 PM)    levETIRAcetam  (KEPPRA ) 500 MG tablet [id number] Yes Take 1 tablet (500 mg total) by mouth 2 (two) times daily. [name], MD  Active Self  metFORMIN (GLUCOPHAGE) 500 MG tablet [id number] Yes Take 500 mg by mouth 2 (two) times daily with a meal. [provider]  Active Self  methotrexate  (RHEUMATREX) 2.5 MG tablet [id number] Yes Take 20 mg by mouth  every Friday. [provider]  Active Self  metoprolol  tartrate (LOPRESSOR ) 50 MG tablet [id number]  Take 25 mg by mouth 2 (two) times daily.  Patient not taking: Reported on 09/18/2024   [provider]  Active Self  oseltamivir  (TAMIFLU ) 75 MG capsule [id number]  Take 1 capsule (75 mg total) by mouth every 12 (twelve) hours.  Patient not taking: Reported on 09/18/2024   [name], PA-C  Active Self  oxyCODONE  (OXY IR/ROXICODONE ) 5 MG immediate release tablet [id number]  Take 1 tablet (5 mg total) by mouth every 6 (six) hours as needed for moderate pain (pain score 4-6).  Patient not taking: Reported on 09/18/2024   Darnella Dorn SAUNDERS, MD  Active   oxyCODONE  (ROXICODONE ) 5 MG immediate release tablet [id number]  Take 1 tablet (5 mg total) by mouth every 6 (six) hours as needed for severe pain (pain score 7-10).  Patient not taking: Reported on 09/18/2024   [name], MD  Active Self           Med Note [name]   Wed Jul 30, 2024 12:14 PM) For use post procedure  simvastatin  (ZOCOR ) 20 MG tablet [id number] Yes Take 1 tablet (20 mg total) by mouth at bedtime. [name], MD  Active Self            Recommendation:   Continue Current Plan of Care  Follow Up Plan:   Telephone follow up appointment date/time:  10/02/24 at 2:30 pm  Warren Quivers RN CM Population Health-Complex Care Management [facility] [phone]

## 2024-09-18 NOTE — Patient Instructions (Signed)
 Visit Information  Thank you for taking time to visit with me today. Please don't hesitate to contact me if I can be of assistance to you before our next scheduled appointment.  Our next appointment is by telephone on 10/02/24 at 2:30 pm Please call the care guide team at [phone] if you need to cancel or reschedule your appointment.   Following is a copy of your care plan:   Goals Addressed             This Visit's Progress    [facility] RN Care Plan-DMII       Problems:  Chronic Disease Management support and education needs related to DMII  Goal: Over the next 60 days days the Patient will attend all scheduled medical appointments: with providers and specialists pertaining to health maintenance as evidenced by patient report and or chart review.         demonstrate Ongoing adherence to prescribed treatment plan for DMII as evidenced by continuing to check BS and keep a record as well as maintaining medication compliance.  demonstrate Ongoing health management independence as evidenced by following a low carb diet, keeping BS within target range and reporting no increase in A1C.        take all medications exactly as prescribed and will call provider for medication related questions as evidenced by patient report.     verbalize understanding of plan for management of DMII as evidenced by continuing to be compliant with treatment plan and reporting any concerns to provider.   Interventions:   Diabetes Interventions: Assessed patient's understanding of A1c goal: < 6.0- goal met Provided education to patient about basic DM disease process Reviewed medications with patient and discussed importance of medication adherence Provided patient with written educational materials related to hypo and hyperglycemia and importance of correct treatment Advised patient, providing education and rationale, to check cbg weekly and record, calling provider  for findings outside established  parameters Screening for signs and symptoms of depression related to chronic disease state  Assessed social determinant of health barriers Lab Results  Component Value Date   HGBA1C 5.6 08/01/2024    Patient Self-Care Activities:  Attend all scheduled provider appointments Call pharmacy for medication refills 3-7 days in advance of running out of medications Call provider office for new concerns or questions  Perform all self care activities independently  Perform IADL's (shopping, preparing meals, housekeeping, managing finances) independently Take medications as prescribed   keep appointment with eye doctor check feet daily for cuts, sores or redness enter blood sugar readings and medication or insulin  into daily log trim toenails straight across drink 6 to 8 glasses of water each day eat fish at least once per week fill half of plate with vegetables keep a food diary limit fast food meals to no more than 1 per week manage portion size prepare main meal at home 3 to 5 days each week switch to sugar-free drinks keep feet up while sitting  Plan:  Telephone follow up appointment with care management team member scheduled for:  10/02/24 at 2:30 pm. The patient has been provided with contact information for the care management team and has been advised to call with any health related questions or concerns.              Please call the Suicide and Crisis Lifeline: 988 call the [place]  National Suicide Prevention Lifeline: 780-587-0368 or TTY: 410-080-4243 TTY 250-874-1926) to talk to a trained counselor call 1-800-273-TALK (toll free, 24 hour  hotline) if you are experiencing a Mental Health or Behavioral Health Crisis or need someone to talk to.  Patient verbalized understanding of Care plan and visit instructions communicated this visit  Warren Quivers RN CM Population Health-Complex Care Management [facility] [phone]

## 2024-09-30 ENCOUNTER — Telehealth: Admitting: Licensed Clinical Social Worker

## 2024-10-02 ENCOUNTER — Telehealth

## 2024-10-08 ENCOUNTER — Telehealth: Admitting: Licensed Clinical Social Worker
